# Patient Record
Sex: Male | Born: 1970
Health system: Southern US, Community
[De-identification: ages and names within clinical notes are randomized; demographics above are authoritative.]

## PROBLEM LIST (undated history)

## (undated) DIAGNOSIS — T7840XA Allergy, unspecified, initial encounter: Secondary | ICD-10-CM

## (undated) DIAGNOSIS — L719 Rosacea, unspecified: Secondary | ICD-10-CM

## (undated) DIAGNOSIS — B019 Varicella without complication: Secondary | ICD-10-CM

## (undated) DIAGNOSIS — E079 Disorder of thyroid, unspecified: Secondary | ICD-10-CM

## (undated) DIAGNOSIS — M1990 Unspecified osteoarthritis, unspecified site: Secondary | ICD-10-CM

## (undated) HISTORY — DX: Other disorders of bilirubin metabolism: E80.6

## (undated) HISTORY — DX: Allergy, unspecified, initial encounter: T78.40XA

## (undated) HISTORY — DX: Rosacea, unspecified: L71.9

## (undated) HISTORY — DX: Unspecified osteoarthritis, unspecified site: M19.90

## (undated) HISTORY — DX: Varicella without complication: B01.9

## (undated) HISTORY — PX: ANTERIOR CERVICAL DECOMP/DISCECTOMY FUSION: SHX1161

## (undated) HISTORY — PX: COLONOSCOPY: SHX174

## (undated) HISTORY — PX: TONSILLECTOMY: SUR1361

## (undated) HISTORY — PX: VASECTOMY: SHX75

## (undated) HISTORY — DX: Disorder of thyroid, unspecified: E07.9

---

## 2014-10-28 ENCOUNTER — Other Ambulatory Visit: Payer: Self-pay | Admitting: Physician Assistant

## 2014-10-28 ENCOUNTER — Ambulatory Visit
Admission: RE | Admit: 2014-10-28 | Discharge: 2014-10-28 | Disposition: A | Discharge status: Home / Self Care | Payer: Managed Care, Other (non HMO) | Source: Ambulatory Visit | Attending: Physician Assistant | Admitting: Physician Assistant

## 2014-10-28 DIAGNOSIS — M5489 Other dorsalgia: Secondary | ICD-10-CM

## 2016-09-06 DIAGNOSIS — M5136 Other intervertebral disc degeneration, lumbar region: Secondary | ICD-10-CM | POA: Diagnosis not present

## 2016-12-31 DIAGNOSIS — E039 Hypothyroidism, unspecified: Secondary | ICD-10-CM | POA: Diagnosis not present

## 2016-12-31 DIAGNOSIS — R5383 Other fatigue: Secondary | ICD-10-CM | POA: Diagnosis not present

## 2016-12-31 DIAGNOSIS — Z8639 Personal history of other endocrine, nutritional and metabolic disease: Secondary | ICD-10-CM | POA: Diagnosis not present

## 2017-01-14 DIAGNOSIS — R454 Irritability and anger: Secondary | ICD-10-CM | POA: Diagnosis not present

## 2017-01-14 DIAGNOSIS — R5382 Chronic fatigue, unspecified: Secondary | ICD-10-CM | POA: Diagnosis not present

## 2017-01-22 DIAGNOSIS — M84375A Stress fracture, left foot, initial encounter for fracture: Secondary | ICD-10-CM | POA: Diagnosis not present

## 2017-01-29 DIAGNOSIS — M84375D Stress fracture, left foot, subsequent encounter for fracture with routine healing: Secondary | ICD-10-CM | POA: Diagnosis not present

## 2017-02-12 DIAGNOSIS — M84375D Stress fracture, left foot, subsequent encounter for fracture with routine healing: Secondary | ICD-10-CM | POA: Diagnosis not present

## 2017-03-13 DIAGNOSIS — M545 Low back pain: Secondary | ICD-10-CM | POA: Diagnosis not present

## 2017-03-13 DIAGNOSIS — R252 Cramp and spasm: Secondary | ICD-10-CM | POA: Diagnosis not present

## 2017-04-23 DIAGNOSIS — M5416 Radiculopathy, lumbar region: Secondary | ICD-10-CM | POA: Diagnosis not present

## 2017-05-12 DIAGNOSIS — M5416 Radiculopathy, lumbar region: Secondary | ICD-10-CM | POA: Diagnosis not present

## 2017-05-13 ENCOUNTER — Encounter: Payer: Self-pay | Admitting: Family Medicine

## 2017-05-13 ENCOUNTER — Ambulatory Visit (INDEPENDENT_AMBULATORY_CARE_PROVIDER_SITE_OTHER): Payer: 59 | Admitting: Family Medicine

## 2017-05-13 VITALS — BP 124/70 | HR 70 | Temp 98.3°F | Resp 12 | Ht 71.0 in | Wt 251.2 lb

## 2017-05-13 DIAGNOSIS — E039 Hypothyroidism, unspecified: Secondary | ICD-10-CM

## 2017-05-13 DIAGNOSIS — J309 Allergic rhinitis, unspecified: Secondary | ICD-10-CM

## 2017-05-13 DIAGNOSIS — R5383 Other fatigue: Secondary | ICD-10-CM

## 2017-05-13 DIAGNOSIS — R6882 Decreased libido: Secondary | ICD-10-CM

## 2017-05-13 DIAGNOSIS — E559 Vitamin D deficiency, unspecified: Secondary | ICD-10-CM | POA: Diagnosis not present

## 2017-05-13 NOTE — Progress Notes (Signed)
HPI:   Daniel Juarez is a 46 y.o. male, who is here today to establish care.  Former PCP: Almyra Free Last preventive routine visit: 05/2016.  Chronic medical problems:  PUD for 30+ years, s/p anterior cervical discectomy in 2013. Allergic rhinitis.   Vit D deficiency, currently he is not on supplementation.Completed treatment in 01/2017. Dx about 4-5 years ago. He has not tolerated OTC Vit D, it exacerbated rosacea.  Hypothyroidism:  Currently he is on Synthroid 175 mcg daily. Dx in 2008. . Tolerating medication well, no side effects reported. He has not noted dysphagia, palpitations, abdominal pain, changes in bowel habits, tremor, cold/heat intolerance, or abnormal weight loss. He follows with endocrinologists, Dr Altheimer.  TSH in 01/2017 reported as normal range.  He follows with dermatologists for facial rosacea, currently on oral (? Doxycycline) and topical treatment. Dr Marline Backbone  OA, follows with ortho. He is on Meloxicam.  He lives with his wife and 3 children.   Concerns today:   Fatigue:  He has had it for at least 6 months, more constant for the past months or so.He is concerned about this to be related to low Vit D or abnormal thyroid function. Decreased libido. Denies ED but not as strong as it used to be.   He reports having work-up done by former PCP that included testosterone and TSH they were in normal range, thyroid function sightly higher and testosterone on lower normal range (12/2016 it was 304.7). He would like both re-check. 12/2016 CBC,CMP, TSH (1.71) K+ 5.4. 25 OH vit D 29.6. B12 605.  Hx of hyperbilirubinemia, last T. Bili 1.2.    Reports that he has had left foot stress fractures x 2 in the past few months, he follows with podiatrists.   He denies depression or anxiety. Sleeps well most of the time, 7 hours. No hx of OSA. Denies daytime hypersomnolence. Has increased caffeine intake for the past month for the  past 6 months.  He does not exercise regularly. He thinks his diet is healthy overall.   Review of Systems  Constitutional: Positive for fatigue. Negative for activity change, appetite change, fever and unexpected weight change.  HENT: Positive for congestion and rhinorrhea. Negative for facial swelling, nosebleeds, sinus pain, sore throat and trouble swallowing.   Eyes: Negative for redness and visual disturbance.  Respiratory: Negative for apnea, cough, shortness of breath and wheezing.   Cardiovascular: Negative for chest pain, palpitations and leg swelling.  Gastrointestinal: Negative for abdominal pain, nausea and vomiting.  Endocrine: Negative for cold intolerance, heat intolerance, polydipsia, polyphagia and polyuria.  Genitourinary: Negative for decreased urine volume, dysuria and hematuria.  Musculoskeletal: Positive for arthralgias. Negative for gait problem and myalgias.  Skin: Negative for pallor and wound.  Allergic/Immunologic: Positive for environmental allergies.  Neurological: Negative for dizziness, syncope, weakness and headaches.  Hematological: Negative for adenopathy. Does not bruise/bleed easily.  Psychiatric/Behavioral: Negative for confusion and sleep disturbance. The patient is nervous/anxious.      No current outpatient prescriptions on file prior to visit.   No current facility-administered medications on file prior to visit.      Past Medical History:  Diagnosis Date  . Allergy   . Arthritis   . Chicken pox   . Hyperbilirubinemia   . Rosacea, unspecified    Follows with dermatologists.  . Thyroid disease    Allergies no known allergies  Family History  Problem Relation Age of Onset  . Diabetes Mother   .  Hypertension Mother   . Atrial fibrillation Mother   . Multiple sclerosis Father   . Cerebral palsy Sister   . Atrial fibrillation Brother     Social History   Social History  . Marital status: Married    Spouse name: N/A  . Number  of children: N/A  . Years of education: N/A   Social History Main Topics  . Smoking status: Never Smoker  . Smokeless tobacco: Never Used  . Alcohol use Yes  . Drug use: No  . Sexual activity: Yes    Partners: Female   Other Topics Concern  . None   Social History Narrative  . None    Vitals:   05/13/17 0952  BP: 124/70  Pulse: 70  Resp: 12  Temp: 98.3 F (36.8 C)  SpO2: 97%    Body mass index is 35.04 kg/m.   Physical Exam  Nursing note and vitals reviewed. Constitutional: He is oriented to person, place, and time. He appears well-developed. No distress.  HENT:  Head: Normocephalic and atraumatic.  Mouth/Throat: Oropharynx is clear and moist and mucous membranes are normal.  Eyes: Pupils are equal, round, and reactive to light. Conjunctivae and EOM are normal.  Neck: No tracheal deviation present. No thyroid mass and no thyromegaly present.  Cardiovascular: Normal rate and regular rhythm.   No murmur heard. Pulses:      Dorsalis pedis pulses are 2+ on the right side, and 2+ on the left side.  Respiratory: Effort normal and breath sounds normal. No respiratory distress.  GI: Soft. He exhibits no mass. There is no hepatomegaly. There is no tenderness.  Musculoskeletal: He exhibits no edema or tenderness.  No signs of synovitis.  Lymphadenopathy:    He has no cervical adenopathy.  Neurological: He is alert and oriented to person, place, and time. He has normal strength. Coordination and gait normal.  Skin: Skin is warm. Rash (macular erythema on cheeks,nasal folds,and left preauricular.) noted. Rash is macular.  Psychiatric: His mood appears anxious.  Well groomed, good eye contact.    ASSESSMENT AND PLAN:   [name] was seen today for establish care.  Diagnoses and all orders for this visit:  Lab Results  Component Value Date   TSH 1.16 05/14/2017   Lab Results  Component Value Date   TESTOSTERONE 398.84 05/14/2017   Lab Results  Component Value  Date   ESRSEDRATE 3 05/14/2017   Lab Results  Component Value Date   CRP 0.5 05/14/2017   Lab Results  Component Value Date   CREATININE 0.96 05/14/2017   BUN 21 05/14/2017   NA 141 05/14/2017   K 4.4 05/14/2017   CL 104 05/14/2017   CO2 28 05/14/2017    Fatigue, unspecified type  We discussed possible etiologies: Systemic illness, immunologic,endocrinology,sleep disorder, psychiatric/psychologic, infectious,medications side effects, and idiopathic. Examination today does not suggest a serious process. Healthy diet and regular physical activity may help.  Further recommendations will be given according to lab results.  -     C-reactive protein; Future -     Testosterone; Future -     Sedimentation rate; Future -     Basic metabolic panel; Future  Hypothyroidism, unspecified type  No changes in current management, will follow labs done today and will give further recommendations accordingly. Continue following with endocrinologists.  -     T4, free; Future -     TSH; Future  Vitamin D deficiency, unspecified  Mild. Further recommendations will be given according to lab  results.  -     VITAMIN D 25 Hydroxy (Vit-D Deficiency, Fractures); Future  Allergic rhinitis, unspecified seasonality, unspecified trigger  Stable. This problem can contribute to fatigue. OTC antihistaminic or intranasal steroid may help.  Decreased libido  Possible causes discussed: Endocrine,meds,psychiatric among some. Further recommendations will be given according to lab results.  -     Testosterone; Future      Betty G. Martinique, MD  [facility]. [place] office.

## 2017-05-13 NOTE — Patient Instructions (Signed)
A few things to remember from today's visit:   Decreased libido - Plan: Testosterone  Hypothyroidism, unspecified type - Plan: T4, free, TSH  Vitamin D deficiency, unspecified - Plan: VITAMIN D 25 Hydroxy (Vit-D Deficiency, Fractures)  Allergic rhinitis, unspecified seasonality, unspecified trigger  Fatigue, unspecified type - Plan: C-reactive protein, Testosterone, Sedimentation rate, Basic metabolic panel  It is a common symptom associated with multiple factors: psychologic,medications, systemic illness, sleep disorders,infections, and unknown causes. Some work-up can be done to evaluate for common causes as thyroid disease,anemia,diabetes, or abnormalities in calcium,potassium,or sodium. Regular physical activity as tolerated and a healthy diet is usually might help and usually recommended for chronic fatigue.  Please be sure medication list is accurate. If a new problem present, please set up appointment sooner than planned today.

## 2017-05-14 ENCOUNTER — Other Ambulatory Visit (INDEPENDENT_AMBULATORY_CARE_PROVIDER_SITE_OTHER): Payer: 59

## 2017-05-14 DIAGNOSIS — E039 Hypothyroidism, unspecified: Secondary | ICD-10-CM | POA: Diagnosis not present

## 2017-05-14 DIAGNOSIS — R6882 Decreased libido: Secondary | ICD-10-CM | POA: Diagnosis not present

## 2017-05-14 DIAGNOSIS — E559 Vitamin D deficiency, unspecified: Secondary | ICD-10-CM

## 2017-05-14 DIAGNOSIS — R5383 Other fatigue: Secondary | ICD-10-CM | POA: Diagnosis not present

## 2017-05-14 LAB — T4, FREE: FREE T4: 1.04 ng/dL (ref 0.60–1.60)

## 2017-05-14 LAB — BASIC METABOLIC PANEL
BUN: 21 mg/dL (ref 6–23)
CALCIUM: 9.3 mg/dL (ref 8.4–10.5)
CHLORIDE: 104 meq/L (ref 96–112)
CO2: 28 meq/L (ref 19–32)
CREATININE: 0.96 mg/dL (ref 0.40–1.50)
GFR: 88.81 mL/min (ref >60.00)
GLUCOSE: 85 mg/dL (ref 70–99)
Potassium: 4.4 mEq/L (ref 3.5–5.1)
Sodium: 141 mEq/L (ref 135–145)

## 2017-05-14 LAB — TSH: TSH: 1.16 u[IU]/mL (ref 0.35–4.50)

## 2017-05-14 LAB — VITAMIN D 25 HYDROXY (VIT D DEFICIENCY, FRACTURES): VITD: 34.26 ng/mL (ref 30.00–100.00)

## 2017-05-14 LAB — TESTOSTERONE: Testosterone: 398.84 ng/dL (ref 300.00–890.00)

## 2017-05-14 LAB — SEDIMENTATION RATE: SED RATE: 3 mm/h (ref 0–15)

## 2017-05-14 LAB — C-REACTIVE PROTEIN: CRP: 0.5 mg/dL (ref 0.5–20.0)

## 2017-05-14 NOTE — Progress Notes (Unsigned)
lab

## 2017-05-16 ENCOUNTER — Encounter: Payer: Self-pay | Admitting: Family Medicine

## 2017-05-16 ENCOUNTER — Telehealth: Payer: Self-pay | Admitting: Family Medicine

## 2017-05-16 NOTE — Telephone Encounter (Signed)
Labs have been released to [employer].  Thanks!

## 2017-05-16 NOTE — Telephone Encounter (Signed)
Pt would like his lab results released to mychart. Pt has already gotten a call about results, but would like to look at them on mychart. thanks.

## 2017-05-18 ENCOUNTER — Other Ambulatory Visit: Payer: Self-pay | Admitting: Family Medicine

## 2017-05-18 MED ORDER — VITAMIN D (ERGOCALCIFEROL) 1.25 MG (50000 UNIT) PO CAPS: ORAL_CAPSULE | ORAL | 1 refills | Status: DC

## 2017-05-18 NOTE — Progress Notes (Signed)
ergo 

## 2017-05-21 DIAGNOSIS — E559 Vitamin D deficiency, unspecified: Secondary | ICD-10-CM | POA: Diagnosis not present

## 2017-05-21 DIAGNOSIS — M19072 Primary osteoarthritis, left ankle and foot: Secondary | ICD-10-CM | POA: Diagnosis not present

## 2017-05-21 DIAGNOSIS — M84375A Stress fracture, left foot, initial encounter for fracture: Secondary | ICD-10-CM | POA: Diagnosis not present

## 2017-05-22 ENCOUNTER — Encounter: Payer: Self-pay | Admitting: Family Medicine

## 2017-05-28 DIAGNOSIS — M84375D Stress fracture, left foot, subsequent encounter for fracture with routine healing: Secondary | ICD-10-CM | POA: Diagnosis not present

## 2017-06-03 DIAGNOSIS — Z8349 Family history of other endocrine, nutritional and metabolic diseases: Secondary | ICD-10-CM | POA: Diagnosis not present

## 2017-06-03 DIAGNOSIS — E039 Hypothyroidism, unspecified: Secondary | ICD-10-CM | POA: Diagnosis not present

## 2017-06-03 DIAGNOSIS — E559 Vitamin D deficiency, unspecified: Secondary | ICD-10-CM | POA: Diagnosis not present

## 2017-06-11 DIAGNOSIS — M84375D Stress fracture, left foot, subsequent encounter for fracture with routine healing: Secondary | ICD-10-CM | POA: Diagnosis not present

## 2017-08-27 DIAGNOSIS — L718 Other rosacea: Secondary | ICD-10-CM | POA: Diagnosis not present

## 2017-09-01 ENCOUNTER — Ambulatory Visit: Payer: 59 | Admitting: Nurse Practitioner

## 2017-09-01 ENCOUNTER — Encounter: Payer: Self-pay | Admitting: Nurse Practitioner

## 2017-09-01 VITALS — BP 114/76 | HR 93 | Temp 98.3°F | Ht 71.0 in | Wt 245.0 lb

## 2017-09-01 DIAGNOSIS — J069 Acute upper respiratory infection, unspecified: Secondary | ICD-10-CM

## 2017-09-01 LAB — POC INFLUENZA A&B (BINAX/QUICKVUE)
INFLUENZA A, POC: NEGATIVE
Influenza B, POC: NEGATIVE

## 2017-09-01 MED ORDER — ALBUTEROL SULFATE HFA 108 (90 BASE) MCG/ACT IN AERS: 1.0000 | INHALATION_SPRAY | Freq: Four times a day (QID) | RESPIRATORY_TRACT | 0 refills | Status: DC | PRN

## 2017-09-01 MED ORDER — DM-GUAIFENESIN ER 30-600 MG PO TB12: 1.0000 | ORAL_TABLET | Freq: Two times a day (BID) | ORAL | 0 refills | Status: DC | PRN

## 2017-09-01 MED ORDER — IPRATROPIUM BROMIDE 0.03 % NA SOLN: 2.0000 | Freq: Two times a day (BID) | NASAL | 0 refills | Status: DC

## 2017-09-01 MED ORDER — HYDROCODONE-HOMATROPINE 5-1.5 MG/5ML PO SYRP
5.0000 mL | ORAL_SOLUTION | Freq: Four times a day (QID) | ORAL | 0 refills | Status: DC | PRN
Start: 2017-09-01 — End: 2017-10-13

## 2017-09-01 NOTE — Progress Notes (Signed)
Subjective:  Patient ID: Daniel Juarez, male    DOB: [birth year]  Age: 46 y.o. MRN: [id number]  CC: Cough (coughing,congestion,chills,fever,bodyache,headache/ 2 days. )   URI   This is a new problem. The current episode started yesterday. The problem has been unchanged. The maximum temperature recorded prior to his arrival was 100.4 - 100.9 F. Associated symptoms include congestion, coughing, headaches, joint pain, a plugged ear sensation, rhinorrhea, sinus pain and a sore throat. Pertinent negatives include no vomiting or wheezing. He has tried acetaminophen and NSAIDs for the symptoms. The treatment provided mild relief.    Outpatient Medications Prior to Visit  Medication Sig Dispense Refill  . SYNTHROID 175 MCG tablet Take 1 tablet by mouth daily.    . Vitamin D, Ergocalciferol, (DRISDOL) 50000 units CAPS capsule 1 cap every 3 weeks. 6 capsule 1   No facility-administered medications prior to visit.     ROS See HPI  Objective:  BP 114/76   Pulse 93   Temp 98.3 F (36.8 C)   Ht 5\' 11"  (1.803 m)   Wt 245 lb (111.1 kg)   SpO2 96%   BMI 34.17 kg/m   BP Readings from Last 3 Encounters:  09/01/17 114/76  05/13/17 124/70    Wt Readings from Last 3 Encounters:  09/01/17 245 lb (111.1 kg)  05/13/17 251 lb 4 oz (114 kg)    Physical Exam  Constitutional: He is oriented to person, place, and time. No distress.  HENT:  Right Ear: Tympanic membrane, external ear and ear canal normal.  Left Ear: Tympanic membrane and ear canal normal.  Nose: Mucosal edema and rhinorrhea present. Right sinus exhibits no maxillary sinus tenderness and no frontal sinus tenderness. Left sinus exhibits no maxillary sinus tenderness and no frontal sinus tenderness.  Mouth/Throat: Uvula is midline. Posterior oropharyngeal erythema present. No oropharyngeal exudate.  Eyes: No scleral icterus.  Neck: Normal range of motion. Neck supple.  Cardiovascular: Normal rate and regular rhythm.    Pulmonary/Chest: Effort normal and breath sounds normal.  Lymphadenopathy:    He has no cervical adenopathy.  Neurological: He is alert and oriented to person, place, and time.  Vitals reviewed.   Lab Results  Component Value Date   GLUCOSE 85 05/14/2017   NA 141 05/14/2017   K 4.4 05/14/2017   CL 104 05/14/2017   CREATININE 0.96 05/14/2017   BUN 21 05/14/2017   CO2 28 05/14/2017   TSH 1.16 05/14/2017    Dg Lumbar Spine 2-3 Views  Result Date: 10/28/2014 CLINICAL DATA:  Low back pain for 2 years with tingling in both legs. No recent trauma. Initial encounter. EXAM: LUMBAR SPINE - 2-3 VIEW COMPARISON:  None. FINDINGS: Vertebral body height and alignment are normal. Loss of disc space height and endplate spurring are seen at L5-S1. Intervertebral disc space is otherwise maintained. Paraspinous structures are unremarkable. IMPRESSION: No acute abnormality.  Degenerative disc disease L5-S1. Electronically Signed   By: Inge Rise M.D.   On: 10/28/2014 14:07    Assessment & Plan:   Daniel Juarez was seen today for cough.  Diagnoses and all orders for this visit:  Acute URI -     POC Influenza A&B(BINAX/QUICKVUE) -     albuterol (PROVENTIL HFA;VENTOLIN HFA) 108 (90 Base) MCG/ACT inhaler; Inhale 1-2 puffs into the lungs every 6 (six) hours as needed. -     dextromethorphan-guaiFENesin (MUCINEX DM) 30-600 MG 12hr tablet; Take 1 tablet by mouth 2 (two) times daily as needed for cough. -  HYDROcodone-homatropine (HYCODAN) 5-1.5 MG/5ML syrup; Take 5 mLs by mouth every 6 (six) hours as needed for cough. -     ipratropium (ATROVENT) 0.03 % nasal spray; Place 2 sprays into both nostrils 2 (two) times daily. Do not use for more than 5days.   I am having Daniel Juarez start on albuterol, dextromethorphan-guaiFENesin, HYDROcodone-homatropine, and ipratropium. I am also having him maintain his SYNTHROID and Vitamin D (Ergocalciferol).  Meds ordered this encounter  Medications  . albuterol  (PROVENTIL HFA;VENTOLIN HFA) 108 (90 Base) MCG/ACT inhaler    Sig: Inhale 1-2 puffs into the lungs every 6 (six) hours as needed.    Dispense:  1 Inhaler    Refill:  0    Order Specific Question:   Supervising Provider    Answer:   Lucille Passy [[id number]]  . dextromethorphan-guaiFENesin (MUCINEX DM) 30-600 MG 12hr tablet    Sig: Take 1 tablet by mouth 2 (two) times daily as needed for cough.    Dispense:  14 tablet    Refill:  0    Order Specific Question:   Supervising Provider    Answer:   Lucille Passy [[id number]]  . HYDROcodone-homatropine (HYCODAN) 5-1.5 MG/5ML syrup    Sig: Take 5 mLs by mouth every 6 (six) hours as needed for cough.    Dispense:  60 mL    Refill:  0    Order Specific Question:   Supervising Provider    Answer:   Lucille Passy [[id number]]  . ipratropium (ATROVENT) 0.03 % nasal spray    Sig: Place 2 sprays into both nostrils 2 (two) times daily. Do not use for more than 5days.    Dispense:  30 mL    Refill:  0    Order Specific Question:   Supervising Provider    Answer:   Lucille Passy [[id number]]    Follow-up: Return if symptoms worsen or fail to improve.  Wilfred Lacy, NP

## 2017-09-01 NOTE — Patient Instructions (Addendum)
Negative for influenza A and B  URI Instructions: Encourage adequate oral hydration.  Use over-the-counter  "cold" medicines  such as "Tylenol cold" , "Advil cold",  "Mucinex" or" Mucinex D"  for cough and congestion.  Avoid decongestants if you have high blood pressure. Use" Delsym" or" Robitussin" cough syrup varietis for cough.  You can use plain "Tylenol" or "Advi"l for fever, chills and achyness.   "Common cold" symptoms are usually triggered by a virus.  The antibiotics are usually not necessary. On average, a" viral cold" illness would take 4-7 days to resolve. Please, make an appointment if you are not better or if you're worse.   Upper Respiratory Infection, Adult Most upper respiratory infections (URIs) are caused by a virus. A URI affects the nose, throat, and upper air passages. The most common type of URI is often called "the common cold." Follow these instructions at home:  Take medicines only as told by your doctor.  Gargle warm saltwater or take cough drops to comfort your throat as told by your doctor.  Use a warm mist humidifier or inhale steam from a shower to increase air moisture. This may make it easier to breathe.  Drink enough fluid to keep your pee (urine) clear or pale yellow.  Eat soups and other clear broths.  Have a healthy diet.  Rest as needed.  Go back to work when your fever is gone or your doctor says it is okay. ? You may need to stay home longer to avoid giving your URI to others. ? You can also wear a face mask and wash your hands often to prevent spread of the virus.  Use your inhaler more if you have asthma.  Do not use any tobacco products, including cigarettes, chewing tobacco, or electronic cigarettes. If you need help quitting, ask your doctor. Contact a doctor if:  You are getting worse, not better.  Your symptoms are not helped by medicine.  You have chills.  You are getting more short of breath.  You have brown or red  mucus.  You have yellow or brown discharge from your nose.  You have pain in your face, especially when you bend forward.  You have a fever.  You have puffy (swollen) neck glands.  You have pain while swallowing.  You have white areas in the back of your throat. Get help right away if:  You have very bad or constant: ? Headache. ? Ear pain. ? Pain in your forehead, behind your eyes, and over your cheekbones (sinus pain). ? Chest pain.  You have long-lasting (chronic) lung disease and any of the following: ? Wheezing. ? Long-lasting cough. ? Coughing up blood. ? A change in your usual mucus.  You have a stiff neck.  You have changes in your: ? Vision. ? Hearing. ? Thinking. ? Mood. This information is not intended to replace advice given to you by your health care provider. Make sure you discuss any questions you have with your health care provider. Document Released: 02/05/2008 Document Revised: 04/21/2016 Document Reviewed: 11/24/2013 Elsevier Interactive Patient Education  2018 [employer].

## 2017-09-03 ENCOUNTER — Telehealth: Payer: Self-pay | Admitting: Family Medicine

## 2017-09-03 DIAGNOSIS — R110 Nausea: Secondary | ICD-10-CM

## 2017-09-03 DIAGNOSIS — J0140 Acute pansinusitis, unspecified: Secondary | ICD-10-CM

## 2017-09-03 MED ORDER — AMOXICILLIN-POT CLAVULANATE 875-125 MG PO TABS: 1.0000 | ORAL_TABLET | Freq: Two times a day (BID) | ORAL | 0 refills | Status: DC

## 2017-09-03 MED ORDER — ONDANSETRON HCL 4 MG PO TABS: 4.0000 mg | ORAL_TABLET | Freq: Three times a day (TID) | ORAL | 0 refills | Status: DC | PRN

## 2017-09-03 NOTE — Telephone Encounter (Signed)
Copied from [place] [id number]. Topic: Quick Communication - See Telephone Encounter >> Sep 03, 2017 11:03 AM Ivar Drape wrote: CRM for notification. See Telephone encounter for:  09/03/17. Patient saw Wilfred Lacy on 09/01/17 and she told him to inform her if his fever does not go away.  The patient stated he still has a fever, and he also has other symptoms: an awful headache in the front of his head that will not go away, nausea, upset stomach.

## 2017-09-03 NOTE — Telephone Encounter (Signed)
Pt was wondering what to do about this nausea and stomach upset. He tried pepto bismol but its not helping at all. Please advise.

## 2017-09-03 NOTE — Telephone Encounter (Signed)
Pt is aware of massage below.  

## 2017-09-30 ENCOUNTER — Other Ambulatory Visit: Payer: Self-pay | Admitting: Nurse Practitioner

## 2017-09-30 MED ORDER — ALBUTEROL SULFATE HFA 108 (90 BASE) MCG/ACT IN AERS: 1.0000 | INHALATION_SPRAY | Freq: Four times a day (QID) | RESPIRATORY_TRACT | 0 refills | Status: DC | PRN

## 2017-10-12 NOTE — Progress Notes (Signed)
HPI:  [name] is a 47 y.o.male here today for his routine physical examination.  Last CPE: 05/2016 He lives with his wife and 3 boys.  Regular exercise 3 or more times per week: Plays basketball a few times per week with his children. Following a healthy diet: Yes.   Chronic medical problems: Hypothyroidism,he follows with Dr Altheimer. PUD,cervicalgia s/p discectomy in 2013,vit D deficiency,seasonal allergies,and fatigue among some.  Hx of STD's: Denies   There is no immunization history on file for this patient.   -Denies high alcohol intake, tobacco use, or Hx of illicit drug use.  -Concerns and/or follow up today:   Since his last OV he had another foot fracture, left foot. 2 foot fractures in 3 months.  For the past 2 weeks he has ahd "sinus" issues. Nasal congestion, rhinorrhea, postnasal drainage.  He denies fever, chills, body aches, or sore throat. History of allergic rhinitis, he is not using any OTC medication.  Hypothyroidism:  He is also requesting a TSH check. Currently on Synthroid 175 mcg daily. Tolerating medication well, no side effects reported. He has not noted palpitations, abdominal pain, changes in bowel habits, tremor, cold/heat intolerance, or abnormal weight loss.  Lab Results  Component Value Date   TSH 1.16 05/14/2017    According to patient, he follows with endocrinology as needed.  Vitamin D deficiency: He has not taking Ergocalciferol in a few months.  OTC vitamin D supplementation causes facial erythema, rosacea exacerbation. Last 25 [place] vitamin D in 05/2017 was 34.2.    Review of Systems  Constitutional: Negative for activity change, appetite change, fatigue, fever and unexpected weight change.  HENT: Positive for congestion, postnasal drip, rhinorrhea and sinus pressure. Negative for dental problem, facial swelling, nosebleeds, sore throat, trouble swallowing and voice change.   Eyes: Negative for redness and  visual disturbance.  Respiratory: Negative for cough, shortness of breath and wheezing.   Cardiovascular: Negative for chest pain, palpitations and leg swelling.  Gastrointestinal: Negative for abdominal pain, blood in stool, nausea and vomiting.  Endocrine: Negative for cold intolerance, heat intolerance, polydipsia, polyphagia and polyuria.  Genitourinary: Negative for decreased urine volume, dysuria, genital sores, hematuria and testicular pain.  Musculoskeletal: Negative for gait problem and myalgias.  Skin: Negative for color change and rash.  Allergic/Immunologic: Positive for environmental allergies.  Neurological: Negative for dizziness, syncope, weakness and headaches.  Hematological: Negative for adenopathy. Does not bruise/bleed easily.  Psychiatric/Behavioral: Negative for confusion and sleep disturbance. The patient is not nervous/anxious.   All other systems reviewed and are negative.    Current Outpatient Medications on File Prior to Visit  Medication Sig Dispense Refill  . SYNTHROID 175 MCG tablet Take 1 tablet by mouth daily.     No current facility-administered medications on file prior to visit.      Past Medical History:  Diagnosis Date  . Allergy   . Arthritis   . Chicken pox   . Hyperbilirubinemia   . Rosacea, unspecified    Follows with dermatologists.  . Thyroid disease     Past Surgical History:  Procedure Laterality Date  . TONSILLECTOMY      No Known Allergies  Family History  Problem Relation Age of Onset  . Diabetes Mother   . Hypertension Mother   . Atrial fibrillation Mother   . Multiple sclerosis Father   . Cerebral palsy Sister   . Atrial fibrillation Brother     Social History   Socioeconomic History  .  Marital status: Married    Spouse name: None  . Number of children: None  . Years of education: None  . Highest education level: None  Social Needs  . Financial resource strain: None  . Food insecurity - worry: None  .  Food insecurity - inability: None  . Transportation needs - medical: None  . Transportation needs - non-medical: None  Occupational History  . None  Tobacco Use  . Smoking status: Never Smoker  . Smokeless tobacco: Never Used  Substance and Sexual Activity  . Alcohol use: Yes  . Drug use: No  . Sexual activity: Yes    Partners: Female  Other Topics Concern  . None  Social History Narrative  . None     Vitals:   10/13/17 0814  BP: 122/76  Pulse: 82  Resp: 12  Temp: 98.5 F (36.9 C)  SpO2: 97%   Body mass index is 33.49 kg/m.   Wt Readings from Last 3 Encounters:  10/13/17 240 lb 2 oz (108.9 kg)  09/01/17 245 lb (111.1 kg)  05/13/17 251 lb 4 oz (114 kg)      Physical Exam  Nursing note and vitals reviewed. Constitutional: He is oriented to person, place, and time. He appears well-developed. No distress.  HENT:  Head: Normocephalic and atraumatic.  Right Ear: Hearing, tympanic membrane, external ear and ear canal normal.  Left Ear: Hearing, tympanic membrane, external ear and ear canal normal.  Nose: Rhinorrhea present. Right sinus exhibits no maxillary sinus tenderness and no frontal sinus tenderness. Left sinus exhibits no maxillary sinus tenderness and no frontal sinus tenderness.  Mouth/Throat: Oropharynx is clear and moist and mucous membranes are normal.  Hypertrophic turbinates. Nasal voice. Postnasal drainage.  Eyes: Conjunctivae and EOM are normal. Pupils are equal, round, and reactive to light.  Neck: Normal range of motion. No tracheal deviation present. No thyromegaly present.  Cardiovascular: Normal rate and regular rhythm.  No murmur heard. Pulses:      Dorsalis pedis pulses are 2+ on the right side, and 2+ on the left side.  Respiratory: Effort normal and breath sounds normal. No respiratory distress.  GI: Soft. He exhibits no mass. There is no tenderness.  Genitourinary:  Genitourinary Comments: Refused,no concerns.  Musculoskeletal: He  exhibits no edema or tenderness.  No major deformities appreciated and no signs of synovitis.  Lymphadenopathy:    He has no cervical adenopathy.       Right: No supraclavicular adenopathy present.       Left: No supraclavicular adenopathy present.  Neurological: He is alert and oriented to person, place, and time. He has normal strength. No cranial nerve deficit or sensory deficit. Coordination and gait normal.  Reflex Scores:      Bicep reflexes are 2+ on the right side and 2+ on the left side.      Patellar reflexes are 2+ on the right side and 2+ on the left side. Skin: Skin is warm. No rash noted. No erythema.  Psychiatric: His mood appears anxious.  Well-groomed, good eye contact.     ASSESSMENT AND PLAN:   Mr. Geddy was seen today for annual exam.  Orders Placed This Encounter  Procedures  . DG Bone Density  . Tdap vaccine greater than or equal to 7yo IM  . Comprehensive metabolic panel  . TSH  . Lipid panel  . VITAMIN D 25 Hydroxy (Vit-D Deficiency, Fractures)    Routine general medical examination at a health care facility  We discussed the  importance of regular physical activity and healthy diet for prevention of chronic illness and/or complications. Preventive guidelines reviewed. Vaccination updated.  Ca++ and vit D supplementation recommended. Next CPE in a year.  The 10-year ASCVD risk score Mikey Bussing DC Brooke Bonito., et al., 2013) is: 1.7%   Values used to calculate the score:     Age: 43 years     Sex: Male     Is Non-Hispanic African American: No     Diabetic: No     Tobacco smoker: No     Systolic Blood Pressure: 332 mmHg     Is BP treated: No     HDL Cholesterol: 53.3 mg/dL     Total Cholesterol: 159 mg/dL Diabetes mellitus screening -     Comprehensive metabolic panel  Screening for lipid disorders -     Lipid panel  History of pathologic fracture -     DG Bone Density; Future  Need for Tdap vaccination -     Tdap vaccine greater than or equal to  7yo IM  Hypothyroidism, unspecified No changes in current management, will follow labs done today and will give further recommendations accordingly. He continues following with endocrinologist.  Vitamin D deficiency, unspecified He will continue Ergocalciferol 50,000 U q 3 weeks. Further recommendations will be given according to lab results. F/U in 6-12 months.     Allergic rhinitis Educated about Dx. Flonase nasal spray,saline irrigations,and OTC antihistaminics may help. F/U as needed.     Return in 1 year (on 10/13/2018).    Betty G. Martinique, MD  [facility]. [place] office.

## 2017-10-13 ENCOUNTER — Ambulatory Visit (INDEPENDENT_AMBULATORY_CARE_PROVIDER_SITE_OTHER): Payer: 59 | Admitting: Family Medicine

## 2017-10-13 ENCOUNTER — Encounter: Payer: Self-pay | Admitting: Family Medicine

## 2017-10-13 VITALS — BP 122/76 | HR 82 | Temp 98.5°F | Resp 12 | Ht 71.0 in | Wt 240.1 lb

## 2017-10-13 DIAGNOSIS — Z23 Encounter for immunization: Secondary | ICD-10-CM | POA: Diagnosis not present

## 2017-10-13 DIAGNOSIS — J309 Allergic rhinitis, unspecified: Secondary | ICD-10-CM

## 2017-10-13 DIAGNOSIS — E039 Hypothyroidism, unspecified: Secondary | ICD-10-CM | POA: Diagnosis not present

## 2017-10-13 DIAGNOSIS — E559 Vitamin D deficiency, unspecified: Secondary | ICD-10-CM

## 2017-10-13 DIAGNOSIS — Z87311 Personal history of (healed) other pathological fracture: Secondary | ICD-10-CM

## 2017-10-13 DIAGNOSIS — Z0000 Encounter for general adult medical examination without abnormal findings: Secondary | ICD-10-CM | POA: Diagnosis not present

## 2017-10-13 DIAGNOSIS — Z13220 Encounter for screening for lipoid disorders: Secondary | ICD-10-CM | POA: Diagnosis not present

## 2017-10-13 DIAGNOSIS — Z131 Encounter for screening for diabetes mellitus: Secondary | ICD-10-CM

## 2017-10-13 LAB — LIPID PANEL
CHOLESTEROL: 159 mg/dL (ref 0–200)
HDL: 53.3 mg/dL (ref >39.00)
LDL CALC: 77 mg/dL (ref 0–99)
NonHDL: 105.26
Total CHOL/HDL Ratio: 3
Triglycerides: 143 mg/dL (ref 0.0–149.0)
VLDL: 28.6 mg/dL (ref 0.0–40.0)

## 2017-10-13 LAB — COMPREHENSIVE METABOLIC PANEL
ALBUMIN: 4.4 g/dL (ref 3.5–5.2)
ALK PHOS: 64 U/L (ref 39–117)
ALT: 39 U/L (ref 0–53)
AST: 27 U/L (ref 0–37)
BILIRUBIN TOTAL: 1.3 mg/dL — AB (ref 0.2–1.2)
BUN: 18 mg/dL (ref 6–23)
CALCIUM: 9.5 mg/dL (ref 8.4–10.5)
CO2: 31 meq/L (ref 19–32)
CREATININE: 0.82 mg/dL (ref 0.40–1.50)
Chloride: 101 mEq/L (ref 96–112)
GFR: 106.35 mL/min (ref >60.00)
Glucose, Bld: 88 mg/dL (ref 70–99)
Potassium: 4.8 mEq/L (ref 3.5–5.1)
Sodium: 141 mEq/L (ref 135–145)
TOTAL PROTEIN: 7.4 g/dL (ref 6.0–8.3)

## 2017-10-13 LAB — TSH: TSH: 2.2 u[IU]/mL (ref 0.35–4.50)

## 2017-10-13 LAB — VITAMIN D 25 HYDROXY (VIT D DEFICIENCY, FRACTURES): VITD: 36.99 ng/mL (ref 30.00–100.00)

## 2017-10-13 MED ORDER — VITAMIN D (ERGOCALCIFEROL) 1.25 MG (50000 UNIT) PO CAPS: ORAL_CAPSULE | ORAL | 3 refills | Status: DC

## 2017-10-13 MED ORDER — FLUTICASONE PROPIONATE 50 MCG/ACT NA SUSP: 1.0000 | Freq: Two times a day (BID) | NASAL | 6 refills | Status: DC

## 2017-10-13 NOTE — Assessment & Plan Note (Signed)
No changes in current management, will follow labs done today and will give further recommendations accordingly. He continues following with endocrinologist.

## 2017-10-13 NOTE — Assessment & Plan Note (Signed)
He will continue Ergocalciferol 50,000 U q 3 weeks. Further recommendations will be given according to lab results. F/U in 6-12 months.

## 2017-10-13 NOTE — Patient Instructions (Addendum)
A few things to remember from today's visit: Today you were here for a physical and other problems were also adressed.   Routine general medical examination at a health care facility  Diabetes mellitus screening - Plan: Comprehensive metabolic panel  Screening for lipid disorders - Plan: Lipid panel  History of pathologic fracture - Plan: DG Bone Density  Hypothyroidism, unspecified type - Plan: TSH  Vitamin D deficiency, unspecified - Plan: VITAMIN D 25 Hydroxy (Vit-D Deficiency, Fractures)  Allergic rhinitis, unspecified seasonality, unspecified trigger - Plan: fluticasone (FLONASE) 50 MCG/ACT nasal spray   At least 150 minutes of moderate exercise per week, daily brisk walking for 15-30 min is a good exercise option. Healthy diet low in saturated (animal) fats and sweets and consisting of fresh fruits and vegetables, lean meats such as fish and white chicken and whole grains.  - Vaccines:  Tdap vaccine every 10 years.  Shingles vaccine recommended at age 39, could be given after 49 years of age but not sure about insurance coverage.  Pneumonia vaccines:  Prevnar 35 at 91 and Pneumovax at 15.   -Screening recommendations for low/normal risk males:  Screening for diabetes at age 74 and every 3 years. Earlier screening if cardiovascular risk factors.   Lipid screening at 35 and every 3 years. Screening starts in younger males with cardiovascular risk factors.  Colon cancer screening at age 66 and until age 61.  Prostate cancer screening: some controversy, starts usually at 13: Rectal exam and PSA.  Aortic Abdominal Aneurism once between 2 and 53 years old if ever smoker.  Also recommended:  1. Dental visit- Brush and floss your teeth twice daily; visit your dentist twice a year. 2. Eye doctor- Get an eye exam at least every 2 years. 3. Helmet use- Always wear a helmet when riding a bicycle, motorcycle, rollerblading or skateboarding. 4. Safe sex- If you may be  exposed to sexually transmitted infections, use a condom. 5. Seat belts- Seat belts can save your live; always wear one. 6. Smoke/Carbon Monoxide detectors- These detectors need to be installed on the appropriate level of your home. Replace batteries at least once a year. 7. Skin cancer- When out in the sun please cover up and use sunscreen 15 SPF or higher. 8. Violence- If anyone is threatening or hurting you, please tell your healthcare provider.  9. Drink alcohol in moderation- Limit alcohol intake to one drink or less per day. Never drink and drive.  There are 2 forms of allergic rhinitis: . Seasonal (hay fever): Caused by an allergy to pollen and/or mold spores in the air. Pollen is the fine powder that comes from the stamen of flowering plants. It can be carried through the air and is easily inhaled. Symptoms are seasonal and usually occur in spring, late summer, and fall. Marland Kitchen Perennial: Caused by other allergens such as dust mites, pet hair or dander, or mold. Symptoms occur year-round.  Symptoms: Your symptoms can vary, depending on the severity of your allergies. Symptoms can include: Sneezing, coughing.itching (mostly eyes, nose, mouth, throat and skin),runny nose,stuffy nose.headache,pressure in the nose and cheeks,ear fullness and popping, sore throat.watery, red, or swollen eyes,dark circles under your eyes,trouble smelling, and sometimes hives.  Allergic rhinitis cannot be prevented. You can help your symptoms by avoiding the things that you are allergic, including: . Keeping windows closed. This is especially important during high-pollen seasons. . Washing your hands after petting animals. . Using dust- and mite-proof bedding and mattress covers. . Wearing glasses outside  to protect your eyes. . Showering before bed to wash off allergens from hair and skin. You can also avoid things that can make your symptoms worse, such as: . aerosol sprays . air pollution . cold  temperatures . humidity . irritating fumes . tobacco smoke . wind . wood smoke.   Antihistamines help reduce the sneezing, runny nose, and itchiness of allergies. These come in pill form and as nasal sprays. Allegra,Zyrtec,or Claritin are some examples. Decongestants, such as pseudoephedrine and phenylephrine, help temporarily relieve the stuffy nose of allergies. Decongestants are found in many medicines and come as pills, nose sprays, and nose drops. They could increase heart rate and cause tachycardia and tremor. Nasal Afrin should not be used for more than 3 days because you can become dependent on them. This causes you to feel even more stopped-up when you try to quit using them.  Nasal sprays: steroids or antihistaminics. Over the counter intranasal sterids: Nasocort,Rhinocort,or Flonase.You won't notice their benefits for up to 2 weeks after starting them. Allergy shots or sublingual tablets when other treatment do not help.This is done by immunologists.   Please be sure medication list is accurate. If a new problem present, please set up appointment sooner than planned today.

## 2017-10-13 NOTE — Assessment & Plan Note (Signed)
Educated about Dx. Flonase nasal spray,saline irrigations,and OTC antihistaminics may help. F/U as needed.

## 2017-10-19 ENCOUNTER — Encounter: Payer: Self-pay | Admitting: Family Medicine

## 2017-11-07 ENCOUNTER — Other Ambulatory Visit: Payer: Self-pay | Admitting: *Deleted

## 2017-11-07 DIAGNOSIS — E559 Vitamin D deficiency, unspecified: Secondary | ICD-10-CM

## 2017-11-07 MED ORDER — VITAMIN D (ERGOCALCIFEROL) 1.25 MG (50000 UNIT) PO CAPS: ORAL_CAPSULE | ORAL | 3 refills | Status: DC

## 2017-11-12 ENCOUNTER — Ambulatory Visit
Admission: RE | Admit: 2017-11-12 | Discharge: 2017-11-12 | Disposition: A | Discharge status: Home / Self Care | Payer: 59 | Source: Ambulatory Visit | Attending: Family Medicine | Admitting: Family Medicine

## 2017-11-12 DIAGNOSIS — M85851 Other specified disorders of bone density and structure, right thigh: Secondary | ICD-10-CM | POA: Diagnosis not present

## 2017-11-12 DIAGNOSIS — Z87311 Personal history of (healed) other pathological fracture: Secondary | ICD-10-CM

## 2017-11-15 ENCOUNTER — Encounter: Payer: Self-pay | Admitting: Family Medicine

## 2017-11-26 ENCOUNTER — Ambulatory Visit: Payer: 59 | Admitting: Family Medicine

## 2017-11-26 ENCOUNTER — Encounter: Payer: Self-pay | Admitting: Family Medicine

## 2017-11-26 ENCOUNTER — Ambulatory Visit (INDEPENDENT_AMBULATORY_CARE_PROVIDER_SITE_OTHER): Payer: 59 | Admitting: Family Medicine

## 2017-11-26 VITALS — BP 118/72 | HR 72 | Temp 98.6°F | Resp 12 | Ht 71.0 in | Wt 239.2 lb

## 2017-11-26 DIAGNOSIS — J069 Acute upper respiratory infection, unspecified: Secondary | ICD-10-CM | POA: Diagnosis not present

## 2017-11-26 DIAGNOSIS — J029 Acute pharyngitis, unspecified: Secondary | ICD-10-CM

## 2017-11-26 LAB — POC INFLUENZA A&B (BINAX/QUICKVUE)
INFLUENZA A, POC: NEGATIVE
INFLUENZA B, POC: NEGATIVE

## 2017-11-26 LAB — POCT RAPID STREP A (OFFICE): RAPID STREP A SCREEN: NEGATIVE

## 2017-11-26 MED ORDER — BENZONATATE 100 MG PO CAPS: 200.0000 mg | ORAL_CAPSULE | Freq: Two times a day (BID) | ORAL | 0 refills | Status: AC | PRN

## 2017-11-26 MED ORDER — FLUTICASONE PROPIONATE 50 MCG/ACT NA SUSP: 1.0000 | Freq: Two times a day (BID) | NASAL | 1 refills | Status: DC

## 2017-11-26 MED ORDER — MAGIC MOUTHWASH W/LIDOCAINE: 5.0000 mL | Freq: Three times a day (TID) | ORAL | 0 refills | Status: AC | PRN

## 2017-11-26 NOTE — Patient Instructions (Signed)
A few things to remember from today's visit:   Sore throat - Plan: POC Influenza A&B (Binax test), POC Rapid Strep A, Culture, Group A Strep  Generalized body aches - Plan: POC Influenza A&B (Binax test), POC Rapid Strep A  Fatigue, unspecified type - Plan: POC Influenza A&B (Binax test), POC Rapid Strep A   Symptomatic treatment: Over the counter Acetaminophen 500 mg and/or Ibuprofen (400-600 mg) if there is not contraindications; you can alternate in between both every 4-6 hours. Gargles with saline water and throat lozenges might also help. Cold fluids. Cough and nasal congestion can last a few weeks.    Seek prompt medical evaluation if you are having difficulty breathing, mouth swelling, throat closing up, not able to swallow liquids (drooling), skin rash/bruising, or worsening symptoms.  Please follow up in 2 weeks if not any better.    Please be sure medication list is accurate. If a new problem present, please set up appointment sooner than planned today.

## 2017-11-26 NOTE — Progress Notes (Signed)
ACUTE VISIT  HPI:  Chief Complaint  Patient presents with  . Sore Throat  . Cough    sx started Sunday, taking OTC medications  . Nasal Congestion  . Generalized Body Aches  . Otalgia    Daniel Juarez is a 47 y.o.male here today complaining of 3 days of respiratory symptoms.  Symptoms are started Sunday night, intermittent nonproductive cough. Subjective fever, chills, and sweats when symptoms first started and lasted 24 hours.  Bilateral earache and sore throat. He is more concerned about sore throat, moderate, exacerbated by swallowing and talking. He denies stridor or dysphasia.  He has tried Tourist information centre manager, NyQuil, [place].  URI   This is a new problem. The current episode started in the past 7 days. The problem has been unchanged. Associated symptoms include congestion, coughing, ear pain, a plugged ear sensation, rhinorrhea and a sore throat. Pertinent negatives include no abdominal pain, diarrhea, headaches, nausea, neck pain, rash, sinus pain, sneezing, swollen glands, vomiting or wheezing. He has tried acetaminophen for the symptoms. The treatment provided mild relief.    No Hx of recent travel. + Sick contact: Wife and children with similar symptoms. No known insect bite.  Hx of allergies: Yes, history of allergic rhinitis.   Review of Systems  Constitutional: Positive for appetite change, chills, fatigue and fever. Negative for activity change.  HENT: Positive for congestion, ear pain, postnasal drip, rhinorrhea and sore throat. Negative for facial swelling, hearing loss, mouth sores, sinus pain, sneezing, trouble swallowing and voice change.   Eyes: Negative for discharge, redness and itching.  Respiratory: Positive for cough. Negative for chest tightness, shortness of breath and wheezing.   Gastrointestinal: Negative for abdominal pain, diarrhea, nausea and vomiting.  Musculoskeletal: Positive for myalgias. Negative for gait problem and neck pain.   Skin: Negative for rash.  Allergic/Immunologic: Positive for environmental allergies.  Neurological: Negative for syncope, weakness and headaches.  Hematological: Negative for adenopathy. Does not bruise/bleed easily.  Psychiatric/Behavioral: The patient is nervous/anxious.       Current Outpatient Medications on File Prior to Visit  Medication Sig Dispense Refill  . SYNTHROID 175 MCG tablet Take 1 tablet by mouth daily.    . Vitamin D, Ergocalciferol, (DRISDOL) 50000 units CAPS capsule 1 cap every 3 weeks. 6 capsule 3   No current facility-administered medications on file prior to visit.      Past Medical History:  Diagnosis Date  . Allergy   . Arthritis   . Chicken pox   . Hyperbilirubinemia   . Rosacea, unspecified    Follows with dermatologists.  . Thyroid disease    No Known Allergies  Social History   Socioeconomic History  . Marital status: Married    Spouse name: Not on file  . Number of children: Not on file  . Years of education: Not on file  . Highest education level: Not on file  Occupational History  . Not on file  Social Needs  . Financial resource strain: Not on file  . Food insecurity:    Worry: Not on file    Inability: Not on file  . Transportation needs:    Medical: Not on file    Non-medical: Not on file  Tobacco Use  . Smoking status: Never Smoker  . Smokeless tobacco: Never Used  Substance and Sexual Activity  . Alcohol use: Yes  . Drug use: No  . Sexual activity: Yes    Partners: Female  Lifestyle  .  Physical activity:    Days per week: Not on file    Minutes per session: Not on file  . Stress: Not on file  Relationships  . Social connections:    Talks on phone: Not on file    Gets together: Not on file    Attends religious service: Not on file    Active member of club or organization: Not on file    Attends meetings of clubs or organizations: Not on file    Relationship status: Not on file  Other Topics Concern  . Not on  file  Social History Narrative  . Not on file    Vitals:   11/26/17 1612  BP: 118/72  Pulse: 72  Resp: 12  Temp: 98.6 F (37 C)  SpO2: 97%   Body mass index is 33.37 kg/m.   Physical Exam  Nursing note and vitals reviewed. Constitutional: He is oriented to person, place, and time. He appears well-developed. He does not appear ill. No distress.  HENT:  Head: Normocephalic and atraumatic.  Right Ear: Tympanic membrane, external ear and ear canal normal.  Left Ear: Tympanic membrane, external ear and ear canal normal.  Nose: Rhinorrhea present. Right sinus exhibits no maxillary sinus tenderness and no frontal sinus tenderness. Left sinus exhibits no maxillary sinus tenderness and no frontal sinus tenderness.  Mouth/Throat: Uvula is midline and mucous membranes are normal. Posterior oropharyngeal erythema present. No oropharyngeal exudate or posterior oropharyngeal edema.  Eyes: Conjunctivae are normal.  Cardiovascular: Normal rate and regular rhythm.  No murmur heard. Respiratory: Effort normal and breath sounds normal. No stridor. No respiratory distress.  Lymphadenopathy:       Head (right side): No submandibular adenopathy present.       Head (left side): No submandibular adenopathy present.    He has no cervical adenopathy.  Neurological: He is alert and oriented to person, place, and time. He has normal strength. Gait normal.  Skin: Skin is warm. No rash noted. No erythema.  Psychiatric: His speech is normal. His mood appears anxious.  Well groomed, good eye contact.    ASSESSMENT AND PLAN:   [name] was seen today for sore throat, cough, nasal congestion, generalized body aches and otalgia.  Diagnoses and all orders for this visit:  Sore throat  Rapid strep and flu test here in the office negative. We will follow strep culture. Symptomatic treatment recommended for now.  -     POC Influenza A&B (Binax test) -     POC Rapid Strep A -     Culture, Group A  Strep -     magic mouthwash w/lidocaine SOLN; Take 5 mLs by mouth 3 (three) times daily as needed for up to 10 days for mouth pain. 50 ml of diphenhydramine, alum and mag hydroxide, and lidocaine to make 150 ml  URI, acute  Symptoms suggests a viral etiology, symptomatic treatment recommended at this time. Instructed to monitor for signs of complications, instructed about warning signs. I also explained that cough and nasal congestion can last a few days and sometimes weeks. F/U as needed.   -     POC Influenza A&B (Binax test) -     POC Rapid Strep A -     benzonatate (TESSALON) 100 MG capsule; Take 2 capsules (200 mg total) by mouth 2 (two) times daily as needed for up to 10 days. -     fluticasone (FLONASE) 50 MCG/ACT nasal spray; Place 1 spray into both nostrils 2 (two) times  daily.    -Mr. Tomoya Ringwald was advised to seek attention immediately if symptoms worsen or to follow if they persist or new concerns arise.       Betty G. Martinique, MD  [facility]. [place] office.

## 2017-11-28 LAB — CULTURE, GROUP A STREP
MICRO NUMBER:: 90382570
SPECIMEN QUALITY:: ADEQUATE

## 2017-11-30 ENCOUNTER — Encounter: Payer: Self-pay | Admitting: Family Medicine

## 2018-06-30 DIAGNOSIS — L218 Other seborrheic dermatitis: Secondary | ICD-10-CM | POA: Diagnosis not present

## 2018-06-30 DIAGNOSIS — L821 Other seborrheic keratosis: Secondary | ICD-10-CM | POA: Diagnosis not present

## 2018-07-23 ENCOUNTER — Telehealth: Payer: Self-pay | Admitting: Family Medicine

## 2018-07-23 DIAGNOSIS — Z0279 Encounter for issue of other medical certificate: Secondary | ICD-10-CM

## 2018-07-23 NOTE — Telephone Encounter (Signed)
Patient dropped off Physician Results Form  Fax the form to: [phone]  Call patient to let him know that the form was faxed at: [phone]  Disposition: Dr's Folder

## 2018-07-24 NOTE — Telephone Encounter (Signed)
Form placed on providers desk for completion

## 2018-07-27 NOTE — Telephone Encounter (Signed)
Form faxed and left detailed message informing patient that form was faxed.

## 2018-08-05 ENCOUNTER — Other Ambulatory Visit: Payer: Self-pay | Admitting: Family Medicine

## 2018-08-05 NOTE — Telephone Encounter (Signed)
Requested medication (s) are due for refill today -yes  Requested medication (s) are on the active medication list -yes  Future visit scheduled -yes  Last refill: 04/06/17  Notes to clinic: medication is listed as historical on patient medication list- sent for PCP review and new Rx  Requested Prescriptions  Pending Prescriptions Disp Refills   SYNTHROID 175 MCG tablet      Sig: Take 1 tablet (175 mcg total) by mouth daily.     Endocrinology:  Hypothyroid Agents Failed - 08/05/2018 10:27 AM      Failed - TSH needs to be rechecked within 3 months after an abnormal result. Refill until TSH is due.      Passed - TSH in normal range and within 360 days    TSH  Date Value Ref Range Status  10/13/2017 2.20 0.35 - 4.50 uIU/mL Final         Passed - Valid encounter within last 12 months    Recent Outpatient Visits          8 months ago Sore throat   [place] [facility] [place], Malka So, MD   9 months ago Routine general medical examination at a health care facility   [facility] [place], Malka So, MD   11 months ago Acute URI   [facility], Charlene Brooke, NP   1 year ago Fatigue, unspecified type   [facility] [place], Malka So, MD      Future Appointments            In 2 months [place], Malka So, MD [employer] at [place], [facility]            Requested Prescriptions  Pending Prescriptions Disp Refills   SYNTHROID 175 MCG tablet      Sig: Take 1 tablet (175 mcg total) by mouth daily.     Endocrinology:  Hypothyroid Agents Failed - 08/05/2018 10:27 AM      Failed - TSH needs to be rechecked within 3 months after an abnormal result. Refill until TSH is due.      Passed - TSH in normal range and within 360 days    TSH  Date Value Ref Range Status  10/13/2017 2.20 0.35 - 4.50 uIU/mL Final         Passed - Valid encounter within last 12 months    Recent Outpatient Visits          8 months  ago Sore throat   [place] [facility] [place], Malka So, MD   9 months ago Routine general medical examination at a health care facility   [facility] [place], Malka So, MD   11 months ago Acute URI   [facility] [place], Charlene Brooke, NP   1 year ago Fatigue, unspecified type   [facility] [place], Malka So, MD      Future Appointments            In 2 months [place], Malka So, MD [employer] at [employer], [facility]

## 2018-08-05 NOTE — Telephone Encounter (Signed)
Copied from [place] [id number]. Topic: Quick Communication - Rx Refill/Question >> Aug 05, 2018  8:41 AM Leward Quan A wrote: Medication: SYNTHROID 175 MCG tablet   Has the patient contacted their pharmacy? Yes.   (Agent: If no, request that the patient contact the pharmacy for the refill.) (Agent: If yes, when and what did the pharmacy advise?)  Preferred Pharmacy (with phone number or street name): [facility] [place], [place] [address] [phone] (Phone) [phone] (Fax)    Agent: Please be advised that RX refills may take up to 3 business days. We ask that you follow-up with your pharmacy.

## 2018-08-06 MED ORDER — SYNTHROID 175 MCG PO TABS: 175.0000 ug | ORAL_TABLET | Freq: Every day | ORAL | 0 refills | Status: DC

## 2018-10-14 ENCOUNTER — Encounter: Payer: 59 | Admitting: Family Medicine

## 2018-10-30 ENCOUNTER — Other Ambulatory Visit: Payer: Self-pay | Admitting: Family Medicine

## 2018-11-04 ENCOUNTER — Ambulatory Visit (INDEPENDENT_AMBULATORY_CARE_PROVIDER_SITE_OTHER): Payer: 59 | Admitting: Family Medicine

## 2018-11-04 ENCOUNTER — Encounter: Payer: Self-pay | Admitting: Family Medicine

## 2018-11-04 VITALS — BP 120/70 | HR 73 | Temp 98.1°F | Resp 12 | Ht 71.0 in | Wt 247.5 lb

## 2018-11-04 DIAGNOSIS — Z1329 Encounter for screening for other suspected endocrine disorder: Secondary | ICD-10-CM | POA: Diagnosis not present

## 2018-11-04 DIAGNOSIS — Z13220 Encounter for screening for lipoid disorders: Secondary | ICD-10-CM | POA: Diagnosis not present

## 2018-11-04 DIAGNOSIS — E039 Hypothyroidism, unspecified: Secondary | ICD-10-CM | POA: Diagnosis not present

## 2018-11-04 DIAGNOSIS — Z13228 Encounter for screening for other metabolic disorders: Secondary | ICD-10-CM | POA: Diagnosis not present

## 2018-11-04 DIAGNOSIS — Z0000 Encounter for general adult medical examination without abnormal findings: Secondary | ICD-10-CM

## 2018-11-04 DIAGNOSIS — Z130 Encounter for screening for diseases of the blood and blood-forming organs and certain disorders involving the immune mechanism: Secondary | ICD-10-CM

## 2018-11-04 DIAGNOSIS — E559 Vitamin D deficiency, unspecified: Secondary | ICD-10-CM | POA: Diagnosis not present

## 2018-11-04 LAB — VITAMIN D 25 HYDROXY (VIT D DEFICIENCY, FRACTURES): VITD: 32.8 ng/mL (ref 30.00–100.00)

## 2018-11-04 LAB — LIPID PANEL
Cholesterol: 161 mg/dL (ref 0–200)
HDL: 63.2 mg/dL (ref >39.00)
LDL Cholesterol: 75 mg/dL (ref 0–99)
NonHDL: 97.62
Total CHOL/HDL Ratio: 3
Triglycerides: 114 mg/dL (ref 0.0–149.0)
VLDL: 22.8 mg/dL (ref 0.0–40.0)

## 2018-11-04 LAB — BASIC METABOLIC PANEL
BUN: 20 mg/dL (ref 6–23)
CO2: 32 mEq/L (ref 19–32)
Calcium: 9.4 mg/dL (ref 8.4–10.5)
Chloride: 101 mEq/L (ref 96–112)
Creatinine, Ser: 0.93 mg/dL (ref 0.40–1.50)
GFR: 86.15 mL/min (ref >60.00)
GLUCOSE: 80 mg/dL (ref 70–99)
POTASSIUM: 5 meq/L (ref 3.5–5.1)
Sodium: 138 mEq/L (ref 135–145)

## 2018-11-04 LAB — TSH: TSH: 1.59 u[IU]/mL (ref 0.35–4.50)

## 2018-11-04 NOTE — Patient Instructions (Signed)
A few things to remember from today's visit:   Routine general medical examination at a health care facility  Vitamin D deficiency, unspecified - Plan: VITAMIN D 25 Hydroxy (Vit-D Deficiency, Fractures)  Hypothyroidism, unspecified type - Plan: TSH  Screening for lipoid disorders - Plan: Lipid panel  Screening for endocrine, metabolic and immunity disorder - Plan: Basic metabolic panel   At least 150 minutes of moderate exercise per week, daily brisk walking for 15-30 min is a good exercise option. Healthy diet low in saturated (animal) fats and sweets and consisting of fresh fruits and vegetables, lean meats such as fish and white chicken and whole grains.  - Vaccines:  Tdap vaccine every 10 years.  Shingles vaccine recommended at age 61, could be given after 50 years of age but not sure about insurance coverage.  Pneumonia vaccines:  Prevnar 83 at 5 and Pneumovax at 22.   -Screening recommendations for low/normal risk males:  Screening for diabetes at age 36 and every 3 years. Earlier screening if cardiovascular risk factors.   Lipid screening at 35 and every 3 years. Screening starts in younger males with cardiovascular risk factors.  Colon cancer screening at age 7 and until age 39. Colonoscopy is the ideal, if normal it can be repeated every 10 years.  Immunochemical test (FIT): Hemoccult cards, very sensitive if all 3 cards are done. If negative it can be repeated annually. If positive a colonoscopy should follow.   Cologuard: Intended for patients with average risk factors for colon cancer from 41-58 years old. Anyone with prior history of colon polyps or family history of colonic neoplasia is NOT average risk. May be repeated every 3 years.  It is not as sensitive in older patients. Cost is around  $ 649. If positive a diagnostic colonoscopy must be done.     Prostate cancer screening: some controversy, starts usually at 16: Rectal exam and PSA.  Aortic  Abdominal Aneurism once between 67 and 34 years old if ever smoker.  Also recommended:  1. Dental visit- Brush and floss your teeth twice daily; visit your dentist twice a year. 2. Eye doctor- Get an eye exam at least every 2 years. 3. Helmet use- Always wear a helmet when riding a bicycle, motorcycle, rollerblading or skateboarding. 4. Safe sex- If you may be exposed to sexually transmitted infections, use a condom. 5. Seat belts- Seat belts can save your live; always wear one. 6. Smoke/Carbon Monoxide detectors- These detectors need to be installed on the appropriate level of your home. Replace batteries at least once a year. 7. Skin cancer- When out in the sun please cover up and use sunscreen 15 SPF or higher. 8. Violence- If anyone is threatening or hurting you, please tell your healthcare provider.  9. Drink alcohol in moderation- Limit alcohol intake to one drink or less per day. Never drink and drive.   Please be sure medication list is accurate. If a new problem present, please set up appointment sooner than planned today.

## 2018-11-04 NOTE — Assessment & Plan Note (Signed)
Continue ergocalciferol 50,000 units every 2 weeks. We will adjust treatment according to 25 OH vitamin D results.

## 2018-11-04 NOTE — Progress Notes (Signed)
HPI:  [name] is a 48 y.o.male here today for his routine physical examination.  Last CPE: 10/13/2017 He lives with his wife and their 3 children.  Regular exercise 3 or more times per week: He does play basketball with his children P weight. Following a healthy diet: Yes   Chronic medical problems: Vitamin D deficiency, hypothyroidism, cervicalgia (s/p discectomy in 2013), seasonal allergies, and chronic fatigue. He follows with endocrinologist, Dr. Elyse Hsu but has not seen him in over a year.  He would like for me to check TSH with his labs today. Negative for worsening fatigue, abnormal weight loss, palpitations, tremor, or cold/heat intolerance.   Immunization History  Administered Date(s) Administered  . Tdap 10/13/2017    -Denies high alcohol intake, tobacco use, or Hx of illicit drug use.  Vitamin D deficiency: Currently he is on ergocalciferol 50,000 units every 3 weeks. OTC vitamin D causes some side effects. He has tolerated medication well, denies side effects.  Hypothyroidism, currently he is on levothyroxine 175 mcg daily. Last TSH in 10/2017 was normal at 2.2.   Review of Systems  Constitutional: Negative for activity change, appetite change, fatigue, fever and unexpected weight change.  HENT: Negative for dental problem, nosebleeds, sore throat, trouble swallowing and voice change.   Eyes: Negative for redness and visual disturbance.  Respiratory: Negative for cough, shortness of breath and wheezing.   Cardiovascular: Negative for chest pain, palpitations and leg swelling.  Gastrointestinal: Negative for abdominal pain, blood in stool, nausea and vomiting.  Endocrine: Negative for heat intolerance, polydipsia, polyphagia and polyuria.  Genitourinary: Negative for decreased urine volume, dysuria, genital sores, hematuria and testicular pain.  Musculoskeletal: Negative for gait problem and myalgias.  Skin: Negative for color change and  rash.  Neurological: Negative for syncope, weakness and headaches.  Hematological: Negative for adenopathy. Does not bruise/bleed easily.  Psychiatric/Behavioral: Negative for confusion and sleep disturbance. The patient is not nervous/anxious.      Current Outpatient Medications on File Prior to Visit  Medication Sig Dispense Refill  . fluticasone (FLONASE) 50 MCG/ACT nasal spray Place 1 spray into both nostrils 2 (two) times daily. (Patient taking differently: Place 1 spray into both nostrils as needed. ) 16 g 1  . SYNTHROID 175 MCG tablet Take 1 tablet (175 mcg total) by mouth daily. 90 tablet 0  . Vitamin D, Ergocalciferol, (DRISDOL) 50000 units CAPS capsule 1 cap every 3 weeks. 6 capsule 3   No current facility-administered medications on file prior to visit.      Past Medical History:  Diagnosis Date  . Allergy   . Arthritis   . Chicken pox   . Hyperbilirubinemia   . Rosacea, unspecified    Follows with dermatologists.  . Thyroid disease     Past Surgical History:  Procedure Laterality Date  . TONSILLECTOMY      No Known Allergies  Family History  Problem Relation Age of Onset  . Diabetes Mother   . Hypertension Mother   . Atrial fibrillation Mother   . Multiple sclerosis Father   . Cerebral palsy Sister   . Atrial fibrillation Brother     Social History   Socioeconomic History  . Marital status: Married    Spouse name: Not on file  . Number of children: Not on file  . Years of education: Not on file  . Highest education level: Not on file  Occupational History  . Not on file  Social Needs  . Financial  resource strain: Not on file  . Food insecurity:    Worry: Not on file    Inability: Not on file  . Transportation needs:    Medical: Not on file    Non-medical: Not on file  Tobacco Use  . Smoking status: Never Smoker  . Smokeless tobacco: Never Used  Substance and Sexual Activity  . Alcohol use: Yes  . Drug use: No  . Sexual activity: Yes     Partners: Female  Lifestyle  . Physical activity:    Days per week: Not on file    Minutes per session: Not on file  . Stress: Not on file  Relationships  . Social connections:    Talks on phone: Not on file    Gets together: Not on file    Attends religious service: Not on file    Active member of club or organization: Not on file    Attends meetings of clubs or organizations: Not on file    Relationship status: Not on file  Other Topics Concern  . Not on file  Social History Narrative  . Not on file     Vitals:   11/04/18 0858  BP: 120/70  Pulse: 73  Resp: 12  Temp: 98.1 F (36.7 C)  SpO2: 96%   Body mass index is 34.52 kg/m.   Wt Readings from Last 3 Encounters:  11/04/18 247 lb 8 oz (112.3 kg)  11/26/17 239 lb 4 oz (108.5 kg)  10/13/17 240 lb 2 oz (108.9 kg)    Physical Exam  Nursing note and vitals reviewed. Constitutional: He is oriented to person, place, and time. He appears well-developed. No distress.  HENT:  Head: Normocephalic and atraumatic.  Right Ear: Tympanic membrane, external ear and ear canal normal.  Left Ear: Tympanic membrane, external ear and ear canal normal.  Mouth/Throat: Oropharynx is clear and moist and mucous membranes are normal.  Eyes: Pupils are equal, round, and reactive to light. Conjunctivae and EOM are normal.  Neck: Normal range of motion. No tracheal deviation present. No thyromegaly present.  Cardiovascular: Normal rate and regular rhythm.  No murmur heard. Pulses:      Dorsalis pedis pulses are 2+ on the right side and 2+ on the left side.  Respiratory: Effort normal and breath sounds normal. No respiratory distress.  GI: Soft. He exhibits no mass. There is no hepatomegaly. There is no abdominal tenderness.  Genitourinary:    Genitourinary Comments: Refused,no concerns.   Musculoskeletal:        General: No tenderness or edema.     Comments: No major deformities appreciated and no signs of synovitis.  Lymphadenopathy:     He has no cervical adenopathy.       Right: No supraclavicular adenopathy present.       Left: No supraclavicular adenopathy present.  Neurological: He is alert and oriented to person, place, and time. He has normal strength. No cranial nerve deficit or sensory deficit. Coordination and gait normal.  Reflex Scores:      Bicep reflexes are 2+ on the right side and 2+ on the left side.      Patellar reflexes are 2+ on the right side and 2+ on the left side. Skin: Skin is warm. No erythema.  Psychiatric: He has a normal mood and affect. Cognition and memory are normal.  Well-groomed, good eye contact.    ASSESSMENT AND PLAN:  Mr. Eon was seen today for annual exam.  Orders Placed This Encounter  Procedures  .  VITAMIN D 25 Hydroxy (Vit-D Deficiency, Fractures)  . Basic metabolic panel  . Lipid panel  . TSH   Lab Results  Component Value Date   TSH 1.59 11/04/2018   Lab Results  Component Value Date   CREATININE 0.93 11/04/2018   BUN 20 11/04/2018   NA 138 11/04/2018   K 5.0 11/04/2018   CL 101 11/04/2018   CO2 32 11/04/2018   Lab Results  Component Value Date   CHOL 161 11/04/2018   HDL 63.20 11/04/2018   LDLCALC 75 11/04/2018   TRIG 114.0 11/04/2018   CHOLHDL 3 11/04/2018     Routine general medical examination at a health care facility We discussed the importance of regular physical activity and healthy diet for prevention of chronic illness and/or complications. Preventive guidelines reviewed. Vaccination up-to-date. He will be due for colon cancer screening in 04/2019.  We discussed available options, he will let me know which one he will be interested in doing. Next CPE in a year.  Screening for lipoid disorders -     Lipid panel  Screening for endocrine, metabolic and immunity disorder -     Basic metabolic panel  Hypothyroidism, unspecified No changes in current management, will follow TSH done today and will give further recommendations  accordingly.   Vitamin D deficiency, unspecified Continue ergocalciferol 50,000 units every 2 weeks. We will adjust treatment according to 25 OH vitamin D results.    Return in 1 year (on 11/04/2019) for cpe.    Zana Biancardi G. [place], MD  [facility]. [place] office.

## 2018-11-04 NOTE — Assessment & Plan Note (Signed)
No changes in current management, will follow TSH done today and will give further recommendations accordingly.  

## 2018-11-06 ENCOUNTER — Encounter: Payer: Self-pay | Admitting: Family Medicine

## 2018-11-06 ENCOUNTER — Other Ambulatory Visit: Payer: Self-pay | Admitting: Family Medicine

## 2018-11-06 MED ORDER — SYNTHROID 175 MCG PO TABS: 175.0000 ug | ORAL_TABLET | Freq: Every day | ORAL | 0 refills | Status: DC

## 2018-11-06 MED ORDER — VITAMIN D (ERGOCALCIFEROL) 1.25 MG (50000 UNIT) PO CAPS: ORAL_CAPSULE | ORAL | 3 refills | Status: DC

## 2018-11-06 NOTE — Telephone Encounter (Signed)
PCP has not looked at labs yet, will call with results when she does.

## 2018-11-06 NOTE — Telephone Encounter (Signed)
Copied from [place] [phone]. Topic: General - Other >> Nov 06, 2018 11:03 AM Jodie Echevaria wrote: Reason for CRM: Patient called to say that he was seen on 11/04/2018 had blood work done. Patient took last SYNTHROID 175 MCG tablet this a.m. and is in need of a new Rx sent to pharmacy also would like a calll back today regarding his blood work. Please contact patient at Ph# [phone]

## 2018-11-06 NOTE — Telephone Encounter (Signed)
Copied from [place] [phone]. Topic: Quick Communication - Rx Refill/Question >> Nov 06, 2018 11:06 AM Leward Quan A wrote: Medication: SYNTHROID 175 MCG tablet   Has the patient contacted their pharmacy? Yes.   (Agent: If no, request that the patient contact the pharmacy for the refill.) (Agent: If yes, when and what did the pharmacy advise?)  Preferred Pharmacy (with phone number or street name): [facility] [place], [place] [address] [phone] (Phone) [phone] (Fax)    Agent: Please be advised that RX refills may take up to 3 business days. We ask that you follow-up with your pharmacy.

## 2018-12-02 ENCOUNTER — Encounter: Payer: 59 | Admitting: Family Medicine

## 2019-01-05 ENCOUNTER — Encounter: Payer: Self-pay | Admitting: Family Medicine

## 2019-01-05 ENCOUNTER — Ambulatory Visit (INDEPENDENT_AMBULATORY_CARE_PROVIDER_SITE_OTHER): Payer: 59 | Admitting: Family Medicine

## 2019-01-05 ENCOUNTER — Other Ambulatory Visit: Payer: Self-pay

## 2019-01-05 VITALS — Resp 12

## 2019-01-05 DIAGNOSIS — S80861A Insect bite (nonvenomous), right lower leg, initial encounter: Secondary | ICD-10-CM

## 2019-01-05 DIAGNOSIS — L03115 Cellulitis of right lower limb: Secondary | ICD-10-CM

## 2019-01-05 DIAGNOSIS — W57XXXA Bitten or stung by nonvenomous insect and other nonvenomous arthropods, initial encounter: Secondary | ICD-10-CM | POA: Diagnosis not present

## 2019-01-05 MED ORDER — TRIAMCINOLONE ACETONIDE 0.1 % EX CREA: 1.0000 "application " | TOPICAL_CREAM | Freq: Two times a day (BID) | CUTANEOUS | 0 refills | Status: AC

## 2019-01-05 MED ORDER — DOXYCYCLINE HYCLATE 100 MG PO TABS: 100.0000 mg | ORAL_TABLET | Freq: Two times a day (BID) | ORAL | 0 refills | Status: AC

## 2019-01-05 NOTE — Progress Notes (Signed)
Virtual Visit via Video Note   I connected with Daniel Juarez on 01/05/19 at 12:00 PM EDT by a video enabled telemedicine application and verified that I am speaking with the correct person using two identifiers.  Location patient: home Location provider:home office Persons participating in the virtual visit: patient, provider  I discussed the limitations of evaluation and management by telemedicine and the availability of in person appointments.He expressed understanding and agreed to proceed.   HPI: Daniel Juarez is a 48 yo male concerned about erythematous area behind right knee.  2 days ago he started with pruritus behind right knee, he noticed some bite marks, and assumed it was a mosquito bite. Yesterday morning he noted erythema and edema around affected area and it seems to be spreading down to calf and to distal aspect of right thigh. Mild soreness.  Today he has noticed some improvement.  Constant pruritus, it interferes with sleep.  He has not identified exacerbating or alleviating factors.  He has used OTC Neosporin and he took a antihistaminic. His wife look at the area to be sure it was not a tick embedded.  He denies fever, chills, body aches, unusual fatigue, chest pain, palpitations, dyspnea, abdominal pain, or arthralgias.  Negative for numbness or tingling.  ROS: See pertinent positives and negatives per HPI.   Past Medical History:  Diagnosis Date   Allergy    Arthritis    Chicken pox    Hyperbilirubinemia    Rosacea, unspecified    Follows with dermatologists.   Thyroid disease     Past Surgical History:  Procedure Laterality Date   TONSILLECTOMY      Family History  Problem Relation Age of Onset   Diabetes Mother    Hypertension Mother    Atrial fibrillation Mother    Multiple sclerosis Father    Cerebral palsy Sister    Atrial fibrillation Brother     Social History   Socioeconomic History   Marital status: Married    Spouse  name: Not on file   Number of children: Not on file   Years of education: Not on file   Highest education level: Not on file  Occupational History   Not on file  Social Needs   Financial resource strain: Not on file   Food insecurity:    Worry: Not on file    Inability: Not on file   Transportation needs:    Medical: Not on file    Non-medical: Not on file  Tobacco Use   Smoking status: Never Smoker   Smokeless tobacco: Never Used  Substance and Sexual Activity   Alcohol use: Yes   Drug use: No   Sexual activity: Yes    Partners: Female  Lifestyle   Physical activity:    Days per week: Not on file    Minutes per session: Not on file   Stress: Not on file  Relationships   Social connections:    Talks on phone: Not on file    Gets together: Not on file    Attends religious service: Not on file    Active member of club or organization: Not on file    Attends meetings of clubs or organizations: Not on file    Relationship status: Not on file   Intimate partner violence:    Fear of current or ex partner: Not on file    Emotionally abused: Not on file    Physically abused: Not on file    Forced sexual activity:  Not on file  Other Topics Concern   Not on file  Social History Narrative   Not on file      Current Outpatient Medications:    doxycycline (VIBRA-TABS) 100 MG tablet, Take 1 tablet (100 mg total) by mouth 2 (two) times daily for 7 days., Disp: 14 tablet, Rfl: 0   fluticasone (FLONASE) 50 MCG/ACT nasal spray, Place 1 spray into both nostrils 2 (two) times daily. (Patient taking differently: Place 1 spray into both nostrils as needed. ), Disp: 16 g, Rfl: 1   SYNTHROID 175 MCG tablet, Take 1 tablet (175 mcg total) by mouth daily., Disp: 90 tablet, Rfl: 0   triamcinolone cream (KENALOG) 0.1 %, Apply 1 application topically 2 (two) times daily for 14 days., Disp: 28 g, Rfl: 0   Vitamin D, Ergocalciferol, (DRISDOL) 1.25 MG (50000 UT) CAPS  capsule, 1 cap every 3 weeks., Disp: 6 capsule, Rfl: 3  EXAM:  VITALS per patient if applicable:Resp 12   GENERAL: alert, oriented, appears well and in no acute distress  HEENT: atraumatic, conjunctiva clear, no obvious facial abnormalities on inspection.  NECK: normal movements of the head and neck  LUNGS: on inspection no signs of respiratory distress, breathing rate appears normal, no obvious gross SOB, gasping or wheezing  CV: no obvious cyanosis  MS: moves all visible extremities without noticeable abnormality  SKIN: Right LE,popliteal area with minimal edema and macular erythema. There is a small hyperpigmented area (bite marks?) in the center. See picture.  PSYCH/NEURO: pleasant and cooperative, no obvious depression or anxiety, speech and thought processing grossly intact     ASSESSMENT AND PLAN:  Discussed the following assessment and plan:  Insect bite of right lower leg, initial encounter - Plan: triamcinolone cream (KENALOG) 0.1 % Symptoms could be related with local reaction. Topical steroid cream was recommended to help with pruritus, triamcinolone twice daily for up to 14 days. Prevention of future insect bites, repellent can be used when going outdoors. Recommend keeping area clean with soap and water and avoid scratching.  Cellulitis of right lower extremity - Plan: doxycycline (VIBRA-TABS) 100 MG tablet Stop topical Neosporin. Doxycycline 100 mg twice daily for 7 days recommended. Instructed about warning signs.     I discussed the assessment and treatment plan with the patient. He was provided an opportunity to ask questions and all were answered. The patient agreed with the plan and demonstrated an understanding of the instructions.   He was advised to call back or seek an in-person evaluation if the symptoms worsen or if the condition fails to improve as anticipated.  Return if symptoms worsen or fail to improve.    Metha Kolasa [place], MD

## 2019-01-06 ENCOUNTER — Encounter: Payer: Self-pay | Admitting: Family Medicine

## 2019-01-30 ENCOUNTER — Other Ambulatory Visit: Payer: Self-pay | Admitting: Family Medicine

## 2019-04-27 ENCOUNTER — Other Ambulatory Visit: Payer: Self-pay | Admitting: Family Medicine

## 2019-06-04 ENCOUNTER — Other Ambulatory Visit: Payer: Self-pay | Admitting: Registered"

## 2019-06-04 DIAGNOSIS — Z20822 Contact with and (suspected) exposure to covid-19: Secondary | ICD-10-CM

## 2019-06-05 LAB — NOVEL CORONAVIRUS, NAA: SARS-CoV-2, NAA: NOT DETECTED

## 2019-07-19 ENCOUNTER — Other Ambulatory Visit: Payer: Self-pay | Admitting: Family Medicine

## 2019-09-07 ENCOUNTER — Encounter: Payer: Self-pay | Admitting: Family Medicine

## 2019-09-08 ENCOUNTER — Encounter: Payer: Self-pay | Admitting: Gastroenterology

## 2019-09-08 ENCOUNTER — Other Ambulatory Visit: Payer: Self-pay | Admitting: Family Medicine

## 2019-09-08 DIAGNOSIS — Z1211 Encounter for screening for malignant neoplasm of colon: Secondary | ICD-10-CM

## 2019-09-22 ENCOUNTER — Encounter: Payer: Self-pay | Admitting: Gastroenterology

## 2019-09-22 ENCOUNTER — Ambulatory Visit (AMBULATORY_SURGERY_CENTER): Payer: Self-pay | Admitting: *Deleted

## 2019-09-22 ENCOUNTER — Other Ambulatory Visit: Payer: Self-pay

## 2019-09-22 VITALS — Temp 97.5°F | Ht 71.0 in | Wt 236.8 lb

## 2019-09-22 DIAGNOSIS — Z01818 Encounter for other preprocedural examination: Secondary | ICD-10-CM

## 2019-09-22 DIAGNOSIS — Z1211 Encounter for screening for malignant neoplasm of colon: Secondary | ICD-10-CM

## 2019-09-22 MED ORDER — SUPREP BOWEL PREP KIT 17.5-3.13-1.6 GM/177ML PO SOLN: ORAL | 0 refills | Status: DC

## 2019-09-22 NOTE — Progress Notes (Signed)
Pt is aware that care partner will wait in the car during procedure; if they feel like they will be too hot or cold to wait in the car; they may wait in the 4 th floor lobby. Patient is aware to bring only one care partner. We want them to wear a mask (we do not have any that we can provide them), practice social distancing, and we will check their temperatures when they get here.  I did remind the patient that their care partner needs to stay in the parking lot the entire time and have a cell phone available, we will call them when the pt is ready for discharge. Patient will wear mask into building.  No egg or soy allergy  No home oxygen use or problems with anesthesia  No medications for weight loss taken  emmi information given  $15 off Suprep coupon given  covid test- 09-29-19 at 1:10 p.m.

## 2019-09-29 ENCOUNTER — Other Ambulatory Visit: Payer: Self-pay | Admitting: Gastroenterology

## 2019-09-29 ENCOUNTER — Ambulatory Visit (INDEPENDENT_AMBULATORY_CARE_PROVIDER_SITE_OTHER): Payer: 59

## 2019-09-29 ENCOUNTER — Telehealth: Payer: Self-pay | Admitting: Gastroenterology

## 2019-09-29 DIAGNOSIS — Z1159 Encounter for screening for other viral diseases: Secondary | ICD-10-CM

## 2019-09-29 NOTE — Telephone Encounter (Signed)
Pt is scheduled for a 10/04/19 colonoscopy.  Pharmacy informed that insurance prefers either [place] or [id number].

## 2019-09-29 NOTE — Telephone Encounter (Signed)
Spoke with pharmacy Suprep is [phone]. Gave this universal Coupon=BIN: [id number] PCN: CN GROUP: [id number] and now the price is $50. Pharmacy will go ahead and fill this for the patient.

## 2019-09-30 LAB — SARS CORONAVIRUS 2 (TAT 6-24 HRS): SARS Coronavirus 2: NEGATIVE

## 2019-10-04 ENCOUNTER — Encounter: Payer: Self-pay | Admitting: Gastroenterology

## 2019-10-04 ENCOUNTER — Ambulatory Visit (AMBULATORY_SURGERY_CENTER): Payer: 59 | Admitting: Gastroenterology

## 2019-10-04 ENCOUNTER — Other Ambulatory Visit: Payer: Self-pay

## 2019-10-04 VITALS — BP 108/64 | HR 57 | Temp 96.2°F | Resp 12 | Ht 71.0 in | Wt 236.8 lb

## 2019-10-04 DIAGNOSIS — K635 Polyp of colon: Secondary | ICD-10-CM | POA: Diagnosis not present

## 2019-10-04 DIAGNOSIS — D123 Benign neoplasm of transverse colon: Secondary | ICD-10-CM

## 2019-10-04 DIAGNOSIS — Z1211 Encounter for screening for malignant neoplasm of colon: Secondary | ICD-10-CM

## 2019-10-04 MED ORDER — SODIUM CHLORIDE 0.9 % IV SOLN: 500.0000 mL | Freq: Once | INTRAVENOUS | Status: DC

## 2019-10-04 NOTE — Progress Notes (Signed)
A and O x3. Report to RN. Tolerated MAC anesthesia well.

## 2019-10-04 NOTE — Patient Instructions (Signed)
YOU HAD AN ENDOSCOPIC PROCEDURE TODAY AT [facility]:   Refer to the procedure report that was given to you for any specific questions about what was found during the examination.  If the procedure report does not answer your questions, please call your gastroenterologist to clarify.  If you requested that your care partner not be given the details of your procedure findings, then the procedure report has been included in a sealed envelope for you to review at your convenience later.  YOU SHOULD EXPECT: Some feelings of bloating in the abdomen. Passage of more gas than usual.  Walking can help get rid of the air that was put into your GI tract during the procedure and reduce the bloating. If you had a lower endoscopy (such as a colonoscopy or flexible sigmoidoscopy) you may notice spotting of blood in your stool or on the toilet paper. If you underwent a bowel prep for your procedure, you may not have a normal bowel movement for a few days.  Please Note:  You might notice some irritation and congestion in your nose or some drainage.  This is from the oxygen used during your procedure.  There is no need for concern and it should clear up in a day or so.  SYMPTOMS TO REPORT IMMEDIATELY:   Following lower endoscopy (colonoscopy or flexible sigmoidoscopy):  Excessive amounts of blood in the stool  Significant tenderness or worsening of abdominal pains  Swelling of the abdomen that is new, acute  Fever of 100F or higher    For urgent or emergent issues, a gastroenterologist can be reached at any hour by calling [phone].   DIET:  We do recommend a small meal at first, but then you may proceed to your regular diet.  Drink plenty of fluids but you should avoid alcoholic beverages for 24 hours.  ACTIVITY:  You should plan to take it easy for the rest of today and you should NOT DRIVE or use heavy machinery until tomorrow (because of the sedation medicines used during the test).     FOLLOW UP: Our staff will call the number listed on your records 48-72 hours following your procedure to check on you and address any questions or concerns that you may have regarding the information given to you following your procedure. If we do not reach you, we will leave a message.  We will attempt to reach you two times.  During this call, we will ask if you have developed any symptoms of COVID 19. If you develop any symptoms (ie: fever, flu-like symptoms, shortness of breath, cough etc.) before then, please call [phone].  If you test positive for Covid 19 in the 2 weeks post procedure, please call and report this information to [place].    If any biopsies were taken you will be contacted by phone or by letter within the next 1-3 weeks.  Please call us at [phone] if you have not heard about the biopsies in 3 weeks.    SIGNATURES/CONFIDENTIALITY: You and/or your care partner have signed paperwork which will be entered into your electronic medical record.  These signatures attest to the fact that that the information above on your After Visit Summary has been reviewed and is understood.  Full responsibility of the confidentiality of this discharge information lies with you and/or your care-partner.   Resume medications. Information given on polyps,diverticulosis and hemorrhoids.

## 2019-10-04 NOTE — Progress Notes (Signed)
Called to room to assist during endoscopic procedure.  Patient ID and intended procedure confirmed with present staff. Received instructions for my participation in the procedure from the performing physician.  

## 2019-10-04 NOTE — Progress Notes (Signed)
Pt's states no medical or surgical changes since previsit or office visit.  Temp-jb  V/s-dt

## 2019-10-04 NOTE — Op Note (Signed)
[place] Patient Name: Daniel Juarez Procedure Date: 10/04/2019 11:17 AM MRN: [id number] Endoscopist: Remo Lipps P. Havery Moros , MD Age: 50 Referring MD:  Date of Birth: 05-03-1971 Gender: Male Account #: [id number] Procedure:                Colonoscopy Indications:              Screening for colorectal malignant neoplasm, This                            is the patient's first colonoscopy Medicines:                Monitored Anesthesia Care Procedure:                Pre-Anesthesia Assessment:                           - Prior to the procedure, a History and Physical                            was performed, and patient medications and                            allergies were reviewed. The patient's tolerance of                            previous anesthesia was also reviewed. The risks                            and benefits of the procedure and the sedation                            options and risks were discussed with the patient.                            All questions were answered, and informed consent                            was obtained. Prior Anticoagulants: The patient has                            taken no previous anticoagulant or antiplatelet                            agents. ASA Grade Assessment: II - A patient with                            mild systemic disease. After reviewing the risks                            and benefits, the patient was deemed in                            satisfactory condition to undergo the procedure.  After obtaining informed consent, the colonoscope                            was passed under direct vision. Throughout the                            procedure, the patient's blood pressure, pulse, and                            oxygen saturations were monitored continuously. The                            Colonoscope was introduced through the anus and                            advanced to the the cecum,  identified by                            appendiceal orifice and ileocecal valve. The                            colonoscopy was performed without difficulty. The                            patient tolerated the procedure well. The quality                            of the bowel preparation was good. The ileocecal                            valve, appendiceal orifice, and rectum were                            photographed. Scope In: 11:30:57 AM Scope Out: 11:49:21 AM Scope Withdrawal Time: 0 hours 15 minutes 11 seconds  Total Procedure Duration: 0 hours 18 minutes 24 seconds  Findings:                 The perianal and digital rectal examinations were                            normal.                           A 4 mm polyp was found in the transverse colon. The                            polyp was flat. The polyp was removed with a cold                            snare. Resection and retrieval were complete.                           A single medium-mouthed diverticulum was found in  the transverse colon.                           Internal hemorrhoids were found during retroflexion.                           The exam was otherwise without abnormality. Complications:            No immediate complications. Estimated blood loss:                            Minimal. Estimated Blood Loss:     Estimated blood loss was minimal. Impression:               - One 4 mm polyp in the transverse colon, removed                            with a cold snare. Resected and retrieved.                           - Diverticulosis in the transverse colon.                           - Internal hemorrhoids.                           - The examination was otherwise normal. Recommendation:           - Patient has a contact number available for                            emergencies. The signs and symptoms of potential                            delayed complications were discussed with the                             patient. Return to normal activities tomorrow.                            Written discharge instructions were provided to the                            patient.                           - Resume previous diet.                           - Continue present medications.                           - Await pathology results. Remo Lipps P. Artasia Thang, MD 10/04/2019 11:55:38 AM This report has been signed electronically.

## 2019-10-06 ENCOUNTER — Telehealth: Payer: Self-pay

## 2019-10-06 NOTE — Telephone Encounter (Signed)
  Follow up Call-  Call back number 10/04/2019  Post procedure Call Back phone  # [phone]  Permission to leave phone message Yes  Some recent data might be hidden     Patient questions:  Do you have a fever, pain , or abdominal swelling? No. Pain Score  0 *  Have you tolerated food without any problems? Yes.    Have you been able to return to your normal activities? Yes.    Do you have any questions about your discharge instructions: Diet   No. Medications  No. Follow up visit  No.  Do you have questions or concerns about your Care? No.  Actions: * If pain score is 4 or above: No action needed, pain <4. 1. Have you developed a fever since your procedure? NO  2.   Have you had an respiratory symptoms (SOB or cough) since your procedure? NO  3.   Have you tested positive for COVID 19 since your procedure NO  4.   Have you had any family members/close contacts diagnosed with the COVID 19 since your procedure?  NO   If yes to any of these questions please route to Joylene John, RN and Alphonsa Gin, RN.

## 2019-10-28 ENCOUNTER — Other Ambulatory Visit: Payer: Self-pay | Admitting: Family Medicine

## 2019-11-19 ENCOUNTER — Other Ambulatory Visit: Payer: Self-pay

## 2019-11-19 ENCOUNTER — Ambulatory Visit (INDEPENDENT_AMBULATORY_CARE_PROVIDER_SITE_OTHER): Payer: 59 | Admitting: Family Medicine

## 2019-11-19 ENCOUNTER — Ambulatory Visit (INDEPENDENT_AMBULATORY_CARE_PROVIDER_SITE_OTHER): Payer: 59

## 2019-11-19 ENCOUNTER — Encounter: Payer: Self-pay | Admitting: Family Medicine

## 2019-11-19 VITALS — BP 126/80 | HR 66 | Temp 96.2°F | Resp 12 | Ht 71.0 in | Wt 232.0 lb

## 2019-11-19 DIAGNOSIS — Z0000 Encounter for general adult medical examination without abnormal findings: Secondary | ICD-10-CM | POA: Diagnosis not present

## 2019-11-19 DIAGNOSIS — Z130 Encounter for screening for diseases of the blood and blood-forming organs and certain disorders involving the immune mechanism: Secondary | ICD-10-CM

## 2019-11-19 DIAGNOSIS — Z13228 Encounter for screening for other metabolic disorders: Secondary | ICD-10-CM

## 2019-11-19 DIAGNOSIS — M25552 Pain in left hip: Secondary | ICD-10-CM | POA: Diagnosis not present

## 2019-11-19 DIAGNOSIS — Z1329 Encounter for screening for other suspected endocrine disorder: Secondary | ICD-10-CM

## 2019-11-19 DIAGNOSIS — E039 Hypothyroidism, unspecified: Secondary | ICD-10-CM

## 2019-11-19 DIAGNOSIS — Z13220 Encounter for screening for lipoid disorders: Secondary | ICD-10-CM

## 2019-11-19 DIAGNOSIS — Z125 Encounter for screening for malignant neoplasm of prostate: Secondary | ICD-10-CM | POA: Diagnosis not present

## 2019-11-19 DIAGNOSIS — E559 Vitamin D deficiency, unspecified: Secondary | ICD-10-CM | POA: Diagnosis not present

## 2019-11-19 LAB — COMPREHENSIVE METABOLIC PANEL
ALT: 22 U/L (ref 0–53)
AST: 18 U/L (ref 0–37)
Albumin: 4.7 g/dL (ref 3.5–5.2)
Alkaline Phosphatase: 61 U/L (ref 39–117)
BUN: 16 mg/dL (ref 6–23)
CO2: 31 mEq/L (ref 19–32)
Calcium: 10 mg/dL (ref 8.4–10.5)
Chloride: 102 mEq/L (ref 96–112)
Creatinine, Ser: 0.78 mg/dL (ref 0.40–1.50)
GFR: 105.09 mL/min (ref >60.00)
Glucose, Bld: 82 mg/dL (ref 70–99)
Potassium: 4.6 mEq/L (ref 3.5–5.1)
Sodium: 139 mEq/L (ref 135–145)
Total Bilirubin: 1.6 mg/dL — ABNORMAL HIGH (ref 0.2–1.2)
Total Protein: 7.4 g/dL (ref 6.0–8.3)

## 2019-11-19 LAB — LIPID PANEL
Cholesterol: 162 mg/dL (ref 0–200)
HDL: 60.7 mg/dL (ref >39.00)
LDL Cholesterol: 77 mg/dL (ref 0–99)
NonHDL: 101.48
Total CHOL/HDL Ratio: 3
Triglycerides: 121 mg/dL (ref 0.0–149.0)
VLDL: 24.2 mg/dL (ref 0.0–40.0)

## 2019-11-19 LAB — CBC
HCT: 46.8 % (ref 39.0–52.0)
Hemoglobin: 15.6 g/dL (ref 13.0–17.0)
MCHC: 33.3 g/dL (ref 30.0–36.0)
MCV: 93.7 fl (ref 78.0–100.0)
Platelets: 218 10*3/uL (ref 150.0–400.0)
RBC: 4.99 Mil/uL (ref 4.22–5.81)
RDW: 13.3 % (ref 11.5–15.5)
WBC: 6.3 10*3/uL (ref 4.0–10.5)

## 2019-11-19 LAB — VITAMIN D 25 HYDROXY (VIT D DEFICIENCY, FRACTURES): VITD: 58.87 ng/mL (ref 30.00–100.00)

## 2019-11-19 LAB — TSH: TSH: 0.72 u[IU]/mL (ref 0.35–4.50)

## 2019-11-19 LAB — PSA: PSA: 0.48 ng/mL (ref 0.10–4.00)

## 2019-11-19 LAB — HEMOGLOBIN A1C: Hgb A1c MFr Bld: 5.7 % (ref 4.6–6.5)

## 2019-11-19 NOTE — Assessment & Plan Note (Signed)
Be careful with vit D dose, it can be toxic. No changes in current management, will follow labs done today and will give further recommendations accordingly.

## 2019-11-19 NOTE — Progress Notes (Signed)
HPI:  Daniel Juarez is a 49 y.o.male here today for his routine physical examination.  Last CPE: 11/04/18 He lives with his wife and 3 children.  Regular exercise 3 or more times per week: He is exercising less then usual due to cold weather. Following a healthy diet: Yes. He is doing intermittent fasting 5 days per week.  Chronic medical problems: Vitamin D deficiency, hypothyroidism, neck pain, chronic fatigue, and seasonal allergies among some.  Immunization History  Administered Date(s) Administered  . Influenza-Unspecified 06/04/2017, 06/10/2018, 06/27/2019  . Tdap 10/13/2017  He got his first COVID 19 vaccine,Phyzer, 11/17/19.  Last colon cancer screening: Colonoscopy on 10/04/2019. Last prostate ca screening: N/A Nocturia x 1.  -Denies high alcohol intake, tobacco use, or Hx of illicit drug use.  -Concerns and/or follow up today:   Left hip pain.For a while, intermittent burning like sensation progressively getting worse. Not sure about exacerbating or alleviating factors. It is not interfering with daily activities. No Hx of trauma.  Hypothyroidism: Dx;ed in 2008. He follows with endocrinologist, Dr.Altheimer. Last visit 1.5 years ago. So he would like to have TSH check. He is on Levothyroxine 175 mcg daily. Negative for changes in bowel habits,cold/heat intolerance, abnormal wt loss.  Vit D deficiency:He is on  Ergocalciferol 50,000 U and added extra a few months ago,4000-5000 U daily.  Review of Systems  Constitutional: Positive for fatigue. Negative for activity change, appetite change and fever.  HENT: Negative for dental problem, nosebleeds, sore throat and trouble swallowing.   Eyes: Negative for redness and visual disturbance.  Respiratory: Negative for cough, shortness of breath and wheezing.   Cardiovascular: Negative for chest pain, palpitations and leg swelling.  Gastrointestinal: Negative for abdominal pain, blood in stool, nausea and  vomiting.  Endocrine: Negative for polydipsia, polyphagia and polyuria.  Genitourinary: Negative for decreased urine volume, dysuria, genital sores, hematuria and testicular pain.  Musculoskeletal: Negative for gait problem and joint swelling.  Skin: Negative for color change and rash.  Allergic/Immunologic: Positive for environmental allergies.  Neurological: Negative for dizziness, syncope, weakness and headaches.  Hematological: Negative for adenopathy. Does not bruise/bleed easily.  Psychiatric/Behavioral: Negative for confusion and sleep disturbance. The patient is not nervous/anxious.   All other systems reviewed and are negative.  Current Outpatient Medications on File Prior to Visit  Medication Sig Dispense Refill  . Cholecalciferol (VITAMIN D3 PO) Take 5,000 Units by mouth daily.    . fluticasone (FLONASE) 50 MCG/ACT nasal spray Place 1 spray into both nostrils 2 (two) times daily. (Patient taking differently: Place 1 spray into both nostrils as needed. ) 16 g 1  . Multiple Vitamin (MULTIVITAMIN PO) Take by mouth daily.    . Multiple Vitamins-Minerals (ZINC PO) Take by mouth daily.    Marland Kitchen SYNTHROID 175 MCG tablet TAKE 1 TABLET BY MOUTH EVERY DAY 90 tablet 0  . Vitamin D, Ergocalciferol, (DRISDOL) 1.25 MG (50000 UT) CAPS capsule 1 cap every 3 weeks. 6 capsule 3   No current facility-administered medications on file prior to visit.     Past Medical History:  Diagnosis Date  . Allergy   . Arthritis   . Chicken pox   . Hyperbilirubinemia   . Rosacea, unspecified    Follows with dermatologists.  . Thyroid disease    hypo    Past Surgical History:  Procedure Laterality Date  . ANTERIOR CERVICAL DECOMP/DISCECTOMY FUSION    . TONSILLECTOMY    . VASECTOMY      No  Known Allergies  Family History  Problem Relation Age of Onset  . Diabetes Mother   . Hypertension Mother   . Atrial fibrillation Mother   . Multiple sclerosis Father   . Cerebral palsy Sister   . Atrial  fibrillation Brother   . Colon cancer Neg Hx   . Esophageal cancer Neg Hx   . Rectal cancer Neg Hx   . Stomach cancer Neg Hx     Social History   Socioeconomic History  . Marital status: Married    Spouse name: Not on file  . Number of children: Not on file  . Years of education: Not on file  . Highest education level: Not on file  Occupational History  . Not on file  Tobacco Use  . Smoking status: Never Smoker  . Smokeless tobacco: Never Used  Substance and Sexual Activity  . Alcohol use: Yes    Comment: occasionally  . Drug use: No  . Sexual activity: Yes    Partners: Female  Other Topics Concern  . Not on file  Social History Narrative  . Not on file   Social Determinants of Health   Financial Resource Strain:   . Difficulty of Paying Living Expenses:   Food Insecurity:   . Worried About Charity fundraiser in the Last Year:   . Arboriculturist in the Last Year:   Transportation Needs:   . Film/video editor (Medical):   Marland Kitchen Lack of Transportation (Non-Medical):   Physical Activity:   . Days of Exercise per Week:   . Minutes of Exercise per Session:   Stress:   . Feeling of Stress :   Social Connections:   . Frequency of Communication with Friends and Family:   . Frequency of Social Gatherings with Friends and Family:   . Attends Religious Services:   . Active Member of Clubs or Organizations:   . Attends Archivist Meetings:   Marland Kitchen Marital Status:     Vitals:   11/19/19 0923  BP: 126/80  Pulse: 66  Resp: 12  Temp: (!) 96.2 F (35.7 C)  SpO2: 99%   Body mass index is 32.36 kg/m.   Wt Readings from Last 3 Encounters:  11/19/19 232 lb (105.2 kg)  10/04/19 236 lb 12.8 oz (107.4 kg)  09/22/19 236 lb 12.8 oz (107.4 kg)   Physical Exam  Nursing note and vitals reviewed. Constitutional: He is oriented to person, place, and time. He appears well-developed. No distress.  HENT:  Head: Atraumatic.  Right Ear: Tympanic membrane, external  ear and ear canal normal.  Left Ear: Tympanic membrane, external ear and ear canal normal.  Mouth/Throat: Oropharynx is clear and moist and mucous membranes are normal.  Eyes: Pupils are equal, round, and reactive to light. Conjunctivae and EOM are normal.  Neck: No tracheal deviation present. No thyromegaly present.  Cardiovascular: Normal rate and regular rhythm.  No murmur heard. Pulses:      Dorsalis pedis pulses are 2+ on the right side and 2+ on the left side.  Respiratory: Effort normal and breath sounds normal. No respiratory distress.  GI: Soft. He exhibits no mass. There is no hepatomegaly. There is no abdominal tenderness.  Genitourinary:    Genitourinary Comments: No concerns.   Musculoskeletal:        General: No tenderness or edema.     Cervical back: Normal range of motion.     Left hip: No tenderness or bony tenderness. Normal range of  motion. Normal strength.       Legs:     Comments: No major deformities appreciated and no signs of synovitis.  Lymphadenopathy:    He has no cervical adenopathy.       Right: No inguinal and no supraclavicular adenopathy present.       Left: No inguinal and no supraclavicular adenopathy present.  Neurological: He is alert and oriented to person, place, and time. He has normal strength. No cranial nerve deficit or sensory deficit. Coordination and gait normal.  Reflex Scores:      Bicep reflexes are 2+ on the right side and 2+ on the left side.      Patellar reflexes are 2+ on the right side and 2+ on the left side. Skin: Skin is warm. No erythema.  Psychiatric: He has a normal mood and affect. Cognition and memory are normal.  Well groomed, good eye contact.   ASSESSMENT AND PLAN:  DanielDaniel Juarez was seen today for annual exam.  Diagnoses and all orders for this visit:  Orders Placed This Encounter  Procedures  . DG Hip Unilat W OR W/O Pelvis 2-3 Views Left  . Comprehensive metabolic panel  . Lipid panel  . Hemoglobin A1c  .  PSA(Must document that pt has been informed of limitations of PSA testing.)  . TSH  . VITAMIN D 25 Hydroxy (Vit-D Deficiency, Fractures)  . CBC   Lab Results  Component Value Date   ALT 22 11/19/2019   AST 18 11/19/2019   ALKPHOS 61 11/19/2019   BILITOT 1.6 (H) 11/19/2019   Lab Results  Component Value Date   CREATININE 0.78 11/19/2019   BUN 16 11/19/2019   NA 139 11/19/2019   K 4.6 11/19/2019   CL 102 11/19/2019   CO2 31 11/19/2019    Lab Results  Component Value Date   WBC 6.3 11/19/2019   HGB 15.6 11/19/2019   HCT 46.8 11/19/2019   MCV 93.7 11/19/2019   PLT 218.0 11/19/2019   Lab Results  Component Value Date   CHOL 162 11/19/2019   HDL 60.70 11/19/2019   LDLCALC 77 11/19/2019   TRIG 121.0 11/19/2019   CHOLHDL 3 11/19/2019   Lab Results  Component Value Date   TSH 0.72 11/19/2019   Lab Results  Component Value Date   PSA 0.48 11/19/2019   Lab Results  Component Value Date   HGBA1C 5.7 11/19/2019    Routine general medical examination at a health care facility We discussed the importance of regular physical activity and healthy diet for prevention of chronic illness and/or complications. Preventive guidelines reviewed. Vaccination up to date.  Next CPE in a year.  Prostate cancer screening -     PSA(Must document that pt has been informed of limitations of PSA testing.)  Screening for lipoid disorders -     Lipid panel  Screening for endocrine, metabolic and immunity disorder -     Comprehensive metabolic panel -     Hemoglobin A1c  Hypothyroidism, unspecified Continue Levothyroxine 175 mcg daily. I will give further recommendations according to TSH results.   Vitamin D deficiency, unspecified Be careful with vit D dose, it can be toxic. No changes in current management, will follow labs done today and will give further recommendations accordingly.   Hip pain, left Pain not reproducible on examination. ? Tendinitis. He refers to hold  on referral for PT or ortho. Monitor for new symptoms. Hip X ray ordered today.   Return in 1 year (on 11/18/2020).  Jaymarie Yeakel G. [place], MD  [facility]. [place] office.

## 2019-11-19 NOTE — Assessment & Plan Note (Signed)
Pain not reproducible on examination. ? Tendinitis. He refers to hold on referral for PT or ortho. Monitor for new symptoms. Hip X ray ordered today.

## 2019-11-19 NOTE — Patient Instructions (Signed)
A few things to remember from today's visit:   Routine general medical examination at a health care facility  Hypothyroidism, unspecified type - Plan: TSH  Vitamin D deficiency, unspecified - Plan: VITAMIN D 25 Hydroxy (Vit-D Deficiency, Fractures)  Hip pain, left - Plan: DG Hip Unilat W OR W/O Pelvis 2-3 Views Left, CBC  Prostate cancer screening - Plan: PSA(Must document that pt has been informed of limitations of PSA testing.)  Screening for lipoid disorders - Plan: Lipid panel  Screening for endocrine, metabolic and immunity disorder - Plan: Comprehensive metabolic panel, Hemoglobin A1c  No changes in medications today. I am checking vit D and thyroid. Hip pain could be tendinitis.Let me know if you want to see ortho.   At least 150 minutes of moderate exercise per week, daily brisk walking for 15-30 min is a good exercise option. Healthy diet low in saturated (animal) fats and sweets and consisting of fresh fruits and vegetables, lean meats such as fish and white chicken and whole grains.  - Vaccines:  Tdap vaccine every 10 years.  Shingles vaccine recommended at age 51, could be given after 51 years of age but not sure about insurance coverage.  Pneumonia vaccines: Pneumovax at 53. -Screening recommendations for low/normal risk males:  Screening for diabetes at age 52 and every 3 years. Earlier screening if cardiovascular risk factors.   Lipid screening at 35 and every 3 years. Screening starts in younger males with cardiovascular risk factors.  Colon cancer screening at age 74 and until age 59.  Prostate cancer screening: some controversy, starts usually at 58: Rectal exam and PSA.  Aortic Abdominal Aneurism once between 82 and 17 years old if ever smoker.  Also recommended:  1. Dental visit- Brush and floss your teeth twice daily; visit your dentist twice a year. 2. Eye doctor- Get an eye exam at least every 2 years. 3. Helmet use- Always wear a helmet when  riding a bicycle, motorcycle, rollerblading or skateboarding. 4. Safe sex- If you may be exposed to sexually transmitted infections, use a condom. 5. Seat belts- Seat belts can save your live; always wear one. 6. Smoke/Carbon Monoxide detectors- These detectors need to be installed on the appropriate level of your home. Replace batteries at least once a year. 7. Skin cancer- When out in the sun please cover up and use sunscreen 15 SPF or higher. 8. Violence- If anyone is threatening or hurting you, please tell your healthcare provider.  9. Drink alcohol in moderation- Limit alcohol intake to one drink or less per day. Never drink and drive.  Please be sure medication list is accurate. If a new problem present, please set up appointment sooner than planned today.

## 2019-11-19 NOTE — Assessment & Plan Note (Signed)
Continue Levothyroxine 175 mcg daily. I will give further recommendations according to TSH results.

## 2019-11-21 ENCOUNTER — Encounter: Payer: Self-pay | Admitting: Family Medicine

## 2019-12-07 ENCOUNTER — Other Ambulatory Visit: Payer: Self-pay | Admitting: Family Medicine

## 2019-12-07 DIAGNOSIS — E559 Vitamin D deficiency, unspecified: Secondary | ICD-10-CM

## 2020-01-27 ENCOUNTER — Other Ambulatory Visit: Payer: Self-pay | Admitting: Family Medicine

## 2020-04-20 ENCOUNTER — Other Ambulatory Visit: Payer: Self-pay | Admitting: Family Medicine

## 2020-05-30 ENCOUNTER — Encounter: Payer: Self-pay | Admitting: Family Medicine

## 2020-05-30 ENCOUNTER — Other Ambulatory Visit: Payer: Self-pay

## 2020-05-30 ENCOUNTER — Ambulatory Visit (INDEPENDENT_AMBULATORY_CARE_PROVIDER_SITE_OTHER): Payer: 59 | Admitting: Family Medicine

## 2020-05-30 VITALS — BP 110/70 | HR 97 | Resp 12 | Ht 71.0 in | Wt 241.0 lb

## 2020-05-30 DIAGNOSIS — R5383 Other fatigue: Secondary | ICD-10-CM

## 2020-05-30 DIAGNOSIS — Z1159 Encounter for screening for other viral diseases: Secondary | ICD-10-CM | POA: Diagnosis not present

## 2020-05-30 DIAGNOSIS — E039 Hypothyroidism, unspecified: Secondary | ICD-10-CM

## 2020-05-30 DIAGNOSIS — M79673 Pain in unspecified foot: Secondary | ICD-10-CM | POA: Diagnosis not present

## 2020-05-30 DIAGNOSIS — E559 Vitamin D deficiency, unspecified: Secondary | ICD-10-CM

## 2020-05-30 NOTE — Patient Instructions (Signed)
A few things to remember from today's visit:   Encounter for HCV screening test for low risk patient - Plan: Hepatitis C antibody  Pain of foot, unspecified laterality  Fatigue, unspecified type - Plan: COMPLETE METABOLIC PANEL WITH GFR, TSH, CBC, Testosterone  Hypothyroidism, unspecified type - Plan: TSH  Vitamin D deficiency, unspecified - Plan: COMPLETE METABOLIC PANEL WITH GFR, VITAMIN D 25 Hydroxy (Vit-D Deficiency, Fractures)  If labs are normal a sleep study needs to be considered.  If you need refills please call your pharmacy. Do not use My Chart to request refills or for acute issues that need immediate attention.    Please be sure medication list is accurate. If a new problem present, please set up appointment sooner than planned today.  Fatigue If you have fatigue, you feel tired all the time and have a lack of energy or a lack of motivation. Fatigue may make it difficult to start or complete tasks because of exhaustion. In general, occasional or mild fatigue is often a normal response to activity or life. However, long-lasting (chronic) or extreme fatigue may be a symptom of a medical condition. Follow these instructions at home: General instructions  Watch your fatigue for any changes.  Go to bed and get up at the same time every day.  Avoid fatigue by pacing yourself during the day and getting enough sleep at night.  Maintain a healthy weight. Medicines  Take over-the-counter and prescription medicines only as told by your health care provider.  Take a multivitamin, if told by your health care provider.  Do not use herbal or dietary supplements unless they are approved by your health care provider. Activity   Exercise regularly, as told by your health care provider.  Use or practice techniques to help you relax, such as yoga, tai chi, meditation, or massage therapy. Eating and drinking   Avoid heavy meals in the evening.  Eat a well-balanced diet,  which includes lean proteins, whole grains, plenty of fruits and vegetables, and low-fat dairy products.  Avoid consuming too much caffeine.  Avoid the use of alcohol.  Drink enough fluid to keep your urine pale yellow. Lifestyle  Change situations that cause you stress. Try to keep your work and personal schedule in balance.  Do not use any products that contain nicotine or tobacco, such as cigarettes and e-cigarettes. If you need help quitting, ask your health care provider.  Do not use drugs. Contact a health care provider if:  Your fatigue does not get better.  You have a fever.  You suddenly lose or gain weight.  You have headaches.  You have trouble falling asleep or sleeping through the night.  You feel angry, guilty, anxious, or sad.  You are unable to have a bowel movement (constipation).  Your skin is dry.  You have swelling in your legs or another part of your body. Get help right away if:  You feel confused.  Your vision is blurry.  You feel faint or you pass out.  You have a severe headache.  You have severe pain in your abdomen, your back, or the area between your waist and hips (pelvis).  You have chest pain, shortness of breath, or an irregular or fast heartbeat.  You are unable to urinate, or you urinate less than normal.  You have abnormal bleeding, such as bleeding from the rectum, vagina, nose, lungs, or nipples.  You vomit blood.  You have thoughts about hurting yourself or others. If you ever feel  like you may hurt yourself or others, or have thoughts about taking your own life, get help right away. You can go to your nearest emergency department or call:  Your local emergency services (911 in the [place]  A suicide crisis helpline, such as the [place] at 669 628 4674. This is open 24 hours a day. Summary  If you have fatigue, you feel tired all the time and have a lack of energy or a lack of  motivation.  Fatigue may make it difficult to start or complete tasks because of exhaustion.  Long-lasting (chronic) or extreme fatigue may be a symptom of a medical condition.  Exercise regularly, as told by your health care provider.  Change situations that cause you stress. Try to keep your work and personal schedule in balance. This information is not intended to replace advice given to you by your health care provider. Make sure you discuss any questions you have with your health care provider. Document Revised: 03/10/2019 Document Reviewed: 05/14/2017 Elsevier Patient Education  2020 [employer].

## 2020-05-30 NOTE — Progress Notes (Signed)
Chief Complaint  Patient presents with  . low energy  . pain in feet  . brain fog    sluggins,not as sharp with thinking   HPI: Daniel Juarez is a 49 y.o. male, who is here today with above concerns. Problems started in 04/2020,fatigue.  Initially worse through the day with activities around the house and now when he gets up he does not feel rested. Sleeps about 7-8 hours most of the time. Fatigue is worse at the end of the day. He feels "slow to think." No hx of recent illness or COVID 19 infection.  Negative for louder snoring or known apnea.  Feeling anxious, "mood swing" and getting annoyed easily for the past few months, since 03/2020. Negative for fever,night sweats,or abnormal wt loss.  Depression screen [facility] 2/9 06/01/2020 11/19/2019 11/06/2018 10/13/2017  Decreased Interest 1 0 0 0  Down, Depressed, Hopeless 0 0 0 0  PHQ - 2 Score 1 0 0 0  Altered sleeping 1 - - -  Tired, decreased energy 3 - - -  Change in appetite 1 - - -  Feeling bad or failure about yourself  0 - - -  Trouble concentrating 2 - - -  Moving slowly or fidgety/restless 0 - - -  Suicidal thoughts 0 - - -  PHQ-9 Score 8 - - -  Difficult doing work/chores Somewhat difficult - - -   GAD 7 : Generalized Anxiety Score 06/01/2020  Nervous, Anxious, on Edge 1  Control/stop worrying 0  Worry too much - different things 1  Trouble relaxing 1  Restless 0  Easily annoyed or irritable 1  Afraid - awful might happen 0  Total GAD 7 Score 4  Anxiety Difficulty Not difficult at all   Vit D deficiency: He is taking OTC gummies with vit D 4000 U and q 3 weeks Ergocalciferol 50,000 U.  Occasional cough, attributed to allergies, post nasal drainage. No associated wheezing,CP,or SOB.  Right foot sharp pain x 3 weeks, on top. Problem started after walking in uneven grass,a hole on his yard.He noted some edema,did not seek medical attention. He has a boot from previous fractures,wore it for 2-3 weeks, and  improved. Milder pain on top of left foot.  He is also c/o some pain/discomfort of teeth.  Hx of MT fracture x 2 without significant trauma. DEXA on 11/12/17:Normal.  Hypothyroidism: Follows with endocrinologist. He had some fatigue in 11/2019 and his endocrinologist recommended "more rest." Feeling "jittery" at times, not sure if this is caused by anxiety.  He is on Levothyroxine 175 mcg daily.  Lab Results  Component Value Date   TSH 0.72 11/19/2019   Negative for ED or decreased libido, states that he is so tired that he is not interested in sex.  Review of Systems  Constitutional: Positive for appetite change (Increased.). Negative for activity change.  HENT: Negative for mouth sores, nosebleeds and sore throat.   Eyes: Negative for redness and visual disturbance.  Cardiovascular: Negative for palpitations and leg swelling.  Gastrointestinal: Negative for abdominal pain, nausea and vomiting.  Endocrine: Negative for cold intolerance and heat intolerance.  Genitourinary: Negative for decreased urine volume, dysuria and hematuria.  Allergic/Immunologic: Positive for environmental allergies.  Neurological: Negative for syncope, facial asymmetry and headaches.  Hematological: Negative for adenopathy. Does not bruise/bleed easily.  Psychiatric/Behavioral: Negative for confusion and sleep disturbance. The patient is nervous/anxious.   Rest see pertinent positives and negatives per HPI.  Current Outpatient Medications on  File Prior to Visit  Medication Sig Dispense Refill  . Cholecalciferol (VITAMIN D3 PO) Take 5,000 Units by mouth daily.    . fluticasone (FLONASE) 50 MCG/ACT nasal spray Place 1 spray into both nostrils 2 (two) times daily. (Patient taking differently: Place 1 spray into both nostrils as needed. ) 16 g 1  . Multiple Vitamin (MULTIVITAMIN PO) Take by mouth daily.    . Multiple Vitamins-Minerals (ZINC PO) Take by mouth daily.    Juarez Kitchen SYNTHROID 175 MCG tablet TAKE 1  TABLET BY MOUTH EVERY DAY 90 tablet 0  . Vitamin D, Ergocalciferol, (DRISDOL) 1.25 MG (50000 UNIT) CAPS capsule TAKE 1 CAPSULE BY MOUTH EVERY 3 WEEKS 4 capsule 3   No current facility-administered medications on file prior to visit.   Past Medical History:  Diagnosis Date  . Allergy   . Arthritis   . Chicken pox   . Hyperbilirubinemia   . Rosacea, unspecified    Follows with dermatologists.  . Thyroid disease    hypo   No Known Allergies  Social History   Socioeconomic History  . Marital status: Married    Spouse name: Not on file  . Number of children: Not on file  . Years of education: Not on file  . Highest education level: Not on file  Occupational History  . Not on file  Tobacco Use  . Smoking status: Never Smoker  . Smokeless tobacco: Never Used  Vaping Use  . Vaping Use: Never used  Substance and Sexual Activity  . Alcohol use: Yes    Comment: occasionally  . Drug use: No  . Sexual activity: Yes    Partners: Female  Other Topics Concern  . Not on file  Social History Narrative  . Not on file   Social Determinants of Health   Financial Resource Strain:   . Difficulty of Paying Living Expenses: Not on file  Food Insecurity:   . Worried About Charity fundraiser in the Last Year: Not on file  . Ran Out of Food in the Last Year: Not on file  Transportation Needs:   . Lack of Transportation (Medical): Not on file  . Lack of Transportation (Non-Medical): Not on file  Physical Activity:   . Days of Exercise per Week: Not on file  . Minutes of Exercise per Session: Not on file  Stress:   . Feeling of Stress : Not on file  Social Connections:   . Frequency of Communication with Friends and Family: Not on file  . Frequency of Social Gatherings with Friends and Family: Not on file  . Attends Religious Services: Not on file  . Active Member of Clubs or Organizations: Not on file  . Attends Archivist Meetings: Not on file  . Marital Status: Not on  file    Vitals:   05/30/20 0954  BP: 110/70  Pulse: 97  Resp: 12  SpO2: 96%   Body mass index is 33.61 kg/m.  Physical Exam Vitals and nursing note reviewed.  Constitutional:      General: He is not in acute distress.    Appearance: He is well-developed.  HENT:     Head: Normocephalic and atraumatic.     Mouth/Throat:     Mouth: Mucous membranes are moist. No oral lesions.     Tongue: No lesions.     Palate: No lesions.     Pharynx: Oropharynx is clear.  Eyes:     Conjunctiva/sclera: Conjunctivae normal.  Pupils: Pupils are equal, round, and reactive to light.  Cardiovascular:     Rate and Rhythm: Normal rate and regular rhythm.     Pulses:          Dorsalis pedis pulses are 2+ on the right side and 2+ on the left side.     Heart sounds: No murmur heard.   Pulmonary:     Effort: Pulmonary effort is normal. No respiratory distress.     Breath sounds: Normal breath sounds.  Abdominal:     Palpations: Abdomen is soft. There is no hepatomegaly or mass.     Tenderness: There is no abdominal tenderness.  Musculoskeletal:     Right foot: Normal range of motion. No deformity.     Left foot: Normal range of motion. No deformity.  Feet:     Right foot:     Skin integrity: No dry skin.     Toenail Condition: Right toenails are normal.     Left foot:     Skin integrity: No dry skin.     Toenail Condition: Left toenails are normal.     Comments: No tenderness elicited with palpation. Lymphadenopathy:     Cervical: No cervical adenopathy.  Skin:    General: Skin is warm.     Findings: No erythema or rash.  Neurological:     General: No focal deficit present.     Mental Status: He is alert and oriented to person, place, and time.     Cranial Nerves: No cranial nerve deficit.     Gait: Gait normal.  Psychiatric:        Mood and Affect: Affect normal. Mood is anxious.     Comments: Well groomed, good eye contact.     ASSESSMENT AND PLAN:  Copelan was seen today  for low energy, pain in feet and brain fog.  Diagnoses and all orders for this visit: Orders Placed This Encounter  Procedures  . COMPLETE METABOLIC PANEL WITH GFR  . TSH  . CBC  . VITAMIN D 25 Hydroxy (Vit-D Deficiency, Fractures)  . Hepatitis C antibody  . Testosterone   Lab Results  Component Value Date   TSH 0.53 05/30/2020   Lab Results  Component Value Date   TESTOSTERONE 432 05/30/2020   Lab Results  Component Value Date   CREATININE 0.83 05/30/2020   BUN 12 05/30/2020   NA 142 05/30/2020   K 4.6 05/30/2020   CL 103 05/30/2020   CO2 30 05/30/2020   Lab Results  Component Value Date   ALT 20 05/30/2020   AST 19 05/30/2020   ALKPHOS 61 11/19/2019   BILITOT 1.8 (H) 05/30/2020   Lab Results  Component Value Date   WBC 6.3 05/30/2020   HGB 15.1 05/30/2020   HCT 45.2 05/30/2020   MCV 93.0 05/30/2020   PLT 227 05/30/2020    Pain of foot, unspecified laterality Pain is not elicited with examination today. I do not think imaging is needed today. Recommend podiatrist evaluation if persistent.  Fatigue, unspecified type We discussed possible etiologies: Systemic illness, immunologic,endocrinology,sleep disorder, psychiatric/psychologic, infectious,medications side effects, and idiopathic. Examination today does not suggest a serious process.  Further recommendations will be given according to lab results.  Hypothyroidism, unspecified type Concerned about lower normal range TSH last visit. Continue Levothyroxine 175 mcg, med will be adjusted if needed and according to TSH.  Vitamin D deficiency, unspecified He is taking more vit D that recommended, Ergocalciferol 50,000 U q and 4000 U. Dose  will be adjusted according to 25 [place] vit D results.  Encounter for HCV screening test for low risk patient -     Hepatitis C antibody   Return if symptoms worsen or fail to improve, for Depending on lab results.   Chade Pitner G. [place], MD  [facility]. [place] office.    A few things to remember from today's visit:   Encounter for HCV screening test for low risk patient - Plan: Hepatitis C antibody  Pain of foot, unspecified laterality  Fatigue, unspecified type - Plan: COMPLETE METABOLIC PANEL WITH GFR, TSH, CBC, Testosterone  Hypothyroidism, unspecified type - Plan: TSH  Vitamin D deficiency, unspecified - Plan: COMPLETE METABOLIC PANEL WITH GFR, VITAMIN D 25 Hydroxy (Vit-D Deficiency, Fractures)  If labs are normal a sleep study needs to be considered.  If you need refills please call your pharmacy. Do not use My Chart to request refills or for acute issues that need immediate attention.    Please be sure medication list is accurate. If a new problem present, please set up appointment sooner than planned today.  Fatigue If you have fatigue, you feel tired all the time and have a lack of energy or a lack of motivation. Fatigue may make it difficult to start or complete tasks because of exhaustion. In general, occasional or mild fatigue is often a normal response to activity or life. However, long-lasting (chronic) or extreme fatigue may be a symptom of a medical condition. Follow these instructions at home: General instructions  Watch your fatigue for any changes.  Go to bed and get up at the same time every day.  Avoid fatigue by pacing yourself during the day and getting enough sleep at night.  Maintain a healthy weight. Medicines  Take over-the-counter and prescription medicines only as told by your health care provider.  Take a multivitamin, if told by your health care provider.  Do not use herbal or dietary supplements unless they are approved by your health care provider. Activity   Exercise regularly, as told by your health care provider.  Use or practice techniques to help you relax, such as yoga, tai chi, meditation, or massage therapy. Eating and drinking   Avoid heavy meals in the  evening.  Eat a well-balanced diet, which includes lean proteins, whole grains, plenty of fruits and vegetables, and low-fat dairy products.  Avoid consuming too much caffeine.  Avoid the use of alcohol.  Drink enough fluid to keep your urine pale yellow. Lifestyle  Change situations that cause you stress. Try to keep your work and personal schedule in balance.  Do not use any products that contain nicotine or tobacco, such as cigarettes and e-cigarettes. If you need help quitting, ask your health care provider.  Do not use drugs. Contact a health care provider if:  Your fatigue does not get better.  You have a fever.  You suddenly lose or gain weight.  You have headaches.  You have trouble falling asleep or sleeping through the night.  You feel angry, guilty, anxious, or sad.  You are unable to have a bowel movement (constipation).  Your skin is dry.  You have swelling in your legs or another part of your body. Get help right away if:  You feel confused.  Your vision is blurry.  You feel faint or you pass out.  You have a severe headache.  You have severe pain in your abdomen, your back, or the area between your waist and hips (pelvis).  You have chest pain, shortness of breath, or an irregular or fast heartbeat.  You are unable to urinate, or you urinate less than normal.  You have abnormal bleeding, such as bleeding from the rectum, vagina, nose, lungs, or nipples.  You vomit blood.  You have thoughts about hurting yourself or others. If you ever feel like you may hurt yourself or others, or have thoughts about taking your own life, get help right away. You can go to your nearest emergency department or call:  Your local emergency services (911 in the [place]  A suicide crisis helpline, such as the [place] at 825-714-9155. This is open 24 hours a day. Summary  If you have fatigue, you feel tired all the time and have a  lack of energy or a lack of motivation.  Fatigue may make it difficult to start or complete tasks because of exhaustion.  Long-lasting (chronic) or extreme fatigue may be a symptom of a medical condition.  Exercise regularly, as told by your health care provider.  Change situations that cause you stress. Try to keep your work and personal schedule in balance. This information is not intended to replace advice given to you by your health care provider. Make sure you discuss any questions you have with your health care provider. Document Revised: 03/10/2019 Document Reviewed: 05/14/2017 Elsevier Patient Education  2020 [employer].

## 2020-05-31 LAB — CBC
HCT: 45.2 % (ref 38.5–50.0)
Hemoglobin: 15.1 g/dL (ref 13.2–17.1)
MCH: 31.1 pg (ref 27.0–33.0)
MCHC: 33.4 g/dL (ref 32.0–36.0)
MCV: 93 fL (ref 80.0–100.0)
MPV: 11.1 fL (ref 7.5–12.5)
Platelets: 227 10*3/uL (ref 140–400)
RBC: 4.86 10*6/uL (ref 4.20–5.80)
RDW: 12.3 % (ref 11.0–15.0)
WBC: 6.3 10*3/uL (ref 3.8–10.8)

## 2020-05-31 LAB — COMPLETE METABOLIC PANEL WITH GFR
AG Ratio: 1.7 (calc) (ref 1.0–2.5)
ALT: 20 U/L (ref 9–46)
AST: 19 U/L (ref 10–35)
Albumin: 4.4 g/dL (ref 3.6–5.1)
Alkaline phosphatase (APISO): 54 U/L (ref 35–144)
BUN: 12 mg/dL (ref 7–25)
CO2: 30 mmol/L (ref 20–32)
Calcium: 9.3 mg/dL (ref 8.6–10.3)
Chloride: 103 mmol/L (ref 98–110)
Creat: 0.83 mg/dL (ref 0.70–1.33)
GFR, Est African American: 118 mL/min/{1.73_m2} (ref >=60)
GFR, Est Non African American: 102 mL/min/{1.73_m2} (ref >=60)
Globulin: 2.6 g/dL (calc) (ref 1.9–3.7)
Glucose, Bld: 88 mg/dL (ref 65–99)
Potassium: 4.6 mmol/L (ref 3.5–5.3)
Sodium: 142 mmol/L (ref 135–146)
Total Bilirubin: 1.8 mg/dL — ABNORMAL HIGH (ref 0.2–1.2)
Total Protein: 7 g/dL (ref 6.1–8.1)

## 2020-05-31 LAB — TSH: TSH: 0.53 mIU/L (ref 0.40–4.50)

## 2020-05-31 LAB — TESTOSTERONE: Testosterone: 432 ng/dL (ref 250–827)

## 2020-05-31 LAB — VITAMIN D 25 HYDROXY (VIT D DEFICIENCY, FRACTURES): Vit D, 25-Hydroxy: 70 ng/mL (ref 30–100)

## 2020-05-31 LAB — HEPATITIS C ANTIBODY
Hepatitis C Ab: NONREACTIVE
SIGNAL TO CUT-OFF: 0.01 (ref <1.00)

## 2020-06-01 ENCOUNTER — Encounter: Payer: Self-pay | Admitting: Family Medicine

## 2020-06-02 ENCOUNTER — Encounter: Payer: Self-pay | Admitting: Family Medicine

## 2020-06-02 DIAGNOSIS — E039 Hypothyroidism, unspecified: Secondary | ICD-10-CM

## 2020-06-26 ENCOUNTER — Encounter: Payer: Self-pay | Admitting: Internal Medicine

## 2020-07-12 ENCOUNTER — Other Ambulatory Visit: Payer: Self-pay | Admitting: Family Medicine

## 2020-08-07 ENCOUNTER — Telehealth: Payer: Self-pay | Admitting: Internal Medicine

## 2020-08-07 NOTE — Telephone Encounter (Signed)
Recv'd records from [facility] forwarded 17 pages to Dr. Rosalva Ferron Endocrinology 12/6/21fbg

## 2020-08-08 NOTE — Progress Notes (Signed)
Name: Daniel Juarez  MRN/ DOB: [id number], 1971-05-10    Age/ Sex: 49 y.o., male    PCP: [place], Betty G, MD   Reason for Endocrinology Evaluation: Hypothyroidism      Date of Initial Endocrinology Evaluation: 08/09/2020     HPI: [name] is a 49 y.o. male with a past medical history of hypothyroidism and allergies as well as vitamin D deficiency. The patient presented for initial endocrinology clinic visit on 08/09/2020 for consultative assistance with his Hypothyroidism .   He has been diagnosed with hypothyroidism in 2008. Baseline thyroid levels not available. He has been on LT-4 replacement since then.    In 2012 he was diagnosed with Vitamin B12 and D deficiency  In  Review of his records he was seen by Dr. Elyse Hsu in 2018, per notes he was evaluated in 2014 for cognitive symptoms and fatigue . There was some suspicion of depression and multiple situation stresses at the time,  as all his labs were normal at the time.     Today he continues to complain of memory issues, anxiety, confusion,  alternating bowel movement between constipation but more on the loose side, as well as insomnia.    He feels that his TSH being on the low end is the reason for his above symptoms     Pt also would like to discuss his hx of stress foot fracture in the past. These records are not available. He has jaw and teeth pains but after readings some articles he is concerned there's an issues with the bones. Of note he had a DXA scan in 2019 which came back normal  He recently stepped in a hole and his foot starting hurting just like when he was diagnosed with stress fracture, work boot for ~ 3 weeks.  Pt sees a dentist twice yearly with no concerns from dentist stand point Denies tobacco use or illicit drug use    Levothyroxine 175 mcg , half on Thursday and 1 tablet the res of the week    HISTORY:  Past Medical History:  Past Medical History:  Diagnosis Date  . Allergy    . Arthritis   . Chicken pox   . Hyperbilirubinemia   . Rosacea, unspecified    Follows with dermatologists.  . Thyroid disease    hypo   Past Surgical History:  Past Surgical History:  Procedure Laterality Date  . ANTERIOR CERVICAL DECOMP/DISCECTOMY FUSION    . TONSILLECTOMY    . VASECTOMY        Social History:  reports that he has never smoked. He has never used smokeless tobacco. He reports current alcohol use. He reports that he does not use drugs.  Family History: family history includes Atrial fibrillation in his brother and mother; Cerebral palsy in his sister; Diabetes in his mother; Hypertension in his mother; Multiple sclerosis in his father.   HOME MEDICATIONS: Allergies as of 08/09/2020   No Known Allergies     Medication List       Accurate as of August 09, 2020  9:42 AM. If you have any questions, ask your nurse or doctor.        fluticasone 50 MCG/ACT nasal spray Commonly known as: Flonase Place 1 spray into both nostrils 2 (two) times daily.   MULTIVITAMIN PO Take by mouth daily.   Synthroid 175 MCG tablet Generic drug: levothyroxine TAKE 1 TABLET BY MOUTH EVERY DAY   Vitamin D (Ergocalciferol) 1.25 MG (50000  UNIT) Caps capsule Commonly known as: DRISDOL TAKE 1 CAPSULE BY MOUTH EVERY 3 WEEKS   VITAMIN D3 PO Take 5,000 Units by mouth daily.   ZINC PO Take by mouth daily.         REVIEW OF SYSTEMS: A comprehensive ROS was conducted with the patient and is negative except as per HPI    OBJECTIVE:  VS: BP 130/82   Pulse 70   Ht $R'5\' 11"'FL$  (1.803 m)   Wt 238 lb 8 oz (108.2 kg)   SpO2 98%   BMI 33.26 kg/m    Wt Readings from Last 3 Encounters:  08/09/20 238 lb 8 oz (108.2 kg)  05/30/20 241 lb (109.3 kg)  11/19/19 232 lb (105.2 kg)     EXAM: General: Pt appears well and is in NAD  Mouth : Teeth are intact   Neck: General: Supple without adenopathy. Thyroid: Thyroid size normal.  No goiter or nodules appreciated.   Lungs: Clear    Heart: Auscultation: RRR.  Abdomen: Normoactive bowel sounds, soft, nontender, without masses or organomegaly palpable  Extremities:  BL LE: No pretibial edema normal ROM and strength.  Mental Status: Judgment, insight: Intact Memory: Intact for recent and remote events Mood and affect: No depression, anxiety, or agitation     DATA REVIEWED: Results for JQUAN, EGELSTON (MRN [id number]) as of 08/09/2020 13:32  Ref. Range 08/09/2020 10:09  Sodium Latest Ref Range: 135 - 145 mEq/L 140  Potassium Latest Ref Range: 3.5 - 5.1 mEq/L 4.1  Chloride Latest Ref Range: 96 - 112 mEq/L 102  CO2 Latest Ref Range: 19 - 32 mEq/L 30  Glucose Latest Ref Range: 70 - 99 mg/dL 85  BUN Latest Ref Range: 6 - 23 mg/dL 13  Creatinine Latest Ref Range: 0.40 - 1.50 mg/dL 0.88  Calcium Latest Ref Range: 8.4 - 10.5 mg/dL 9.4  Phosphorus Latest Ref Range: 2.3 - 4.6 mg/dL 3.5  Magnesium Latest Ref Range: 1.5 - 2.5 mg/dL 2.1  Alkaline Phosphatase Latest Ref Range: 39 - 117 U/L 59  Albumin Latest Ref Range: 3.5 - 5.2 g/dL 4.6  AST Latest Ref Range: 0 - 37 U/L 23  ALT Latest Ref Range: 0 - 53 U/L 23  Total Protein Latest Ref Range: 6.0 - 8.3 g/dL 7.8  Total Bilirubin Latest Ref Range: 0.2 - 1.2 mg/dL 1.4 (H)  GFR Latest Ref Range: >60.00 mL/min 99.67  TSH Latest Ref Range: 0.35 - 4.50 uIU/mL 0.86      ASSESSMENT/PLAN/RECOMMENDATIONS:   1. Hypothyroidism:  - Pt with non-specific symptoms  - His PCP reduced his dose back in September, repeat TSh today still at the low end of normal, will adjust dose as below     Medications : Stop Levothyroxine 175 mcg  Start Levothyroxine 150 mcg daily   2. Hx of stress Fracture:   - His Alkaline phosphatase , TSH and calcium level as well as vitamin D have been normal - DXA is normal  - Mg and phosphorus are normal as well as Alk phos. And Vitamin D    F/U in 3 months   Signed electronically by: Mack Guise, MD  [facility]   [address] [facility] [address] [place], [place] [address] Phone: [phone] FAX: [phone]   CC: [place], Betty G, [place] [address] Phone: [phone] Fax: [phone]   Return to Endocrinology clinic as below: No future appointments.

## 2020-08-09 ENCOUNTER — Other Ambulatory Visit: Payer: Self-pay

## 2020-08-09 ENCOUNTER — Ambulatory Visit (INDEPENDENT_AMBULATORY_CARE_PROVIDER_SITE_OTHER): Payer: BC Managed Care – PPO | Admitting: Internal Medicine

## 2020-08-09 ENCOUNTER — Encounter: Payer: Self-pay | Admitting: Internal Medicine

## 2020-08-09 VITALS — BP 130/82 | HR 70 | Ht 71.0 in | Wt 238.5 lb

## 2020-08-09 DIAGNOSIS — E039 Hypothyroidism, unspecified: Secondary | ICD-10-CM | POA: Diagnosis not present

## 2020-08-09 DIAGNOSIS — Z87312 Personal history of (healed) stress fracture: Secondary | ICD-10-CM | POA: Diagnosis not present

## 2020-08-09 LAB — COMPREHENSIVE METABOLIC PANEL
ALT: 23 U/L (ref 0–53)
AST: 23 U/L (ref 0–37)
Albumin: 4.6 g/dL (ref 3.5–5.2)
Alkaline Phosphatase: 59 U/L (ref 39–117)
BUN: 13 mg/dL (ref 6–23)
CO2: 30 mEq/L (ref 19–32)
Calcium: 9.4 mg/dL (ref 8.4–10.5)
Chloride: 102 mEq/L (ref 96–112)
Creatinine, Ser: 0.88 mg/dL (ref 0.40–1.50)
GFR: 99.67 mL/min (ref >60.00)
Glucose, Bld: 85 mg/dL (ref 70–99)
Potassium: 4.1 mEq/L (ref 3.5–5.1)
Sodium: 140 mEq/L (ref 135–145)
Total Bilirubin: 1.4 mg/dL — ABNORMAL HIGH (ref 0.2–1.2)
Total Protein: 7.8 g/dL (ref 6.0–8.3)

## 2020-08-09 LAB — MAGNESIUM: Magnesium: 2.1 mg/dL (ref 1.5–2.5)

## 2020-08-09 LAB — PHOSPHORUS: Phosphorus: 3.5 mg/dL (ref 2.3–4.6)

## 2020-08-09 LAB — TSH: TSH: 0.86 u[IU]/mL (ref 0.35–4.50)

## 2020-08-09 MED ORDER — LEVOTHYROXINE SODIUM 150 MCG PO TABS: 150.0000 ug | ORAL_TABLET | Freq: Every day | ORAL | 3 refills | Status: DC

## 2020-08-09 NOTE — Patient Instructions (Signed)

## 2020-11-08 ENCOUNTER — Ambulatory Visit (INDEPENDENT_AMBULATORY_CARE_PROVIDER_SITE_OTHER): Payer: BC Managed Care – PPO | Admitting: Internal Medicine

## 2020-11-08 ENCOUNTER — Other Ambulatory Visit: Payer: Self-pay

## 2020-11-08 ENCOUNTER — Encounter: Payer: Self-pay | Admitting: Internal Medicine

## 2020-11-08 VITALS — BP 128/82 | HR 60 | Ht 71.0 in | Wt 247.0 lb

## 2020-11-08 DIAGNOSIS — E039 Hypothyroidism, unspecified: Secondary | ICD-10-CM | POA: Insufficient documentation

## 2020-11-08 DIAGNOSIS — Z87312 Personal history of (healed) stress fracture: Secondary | ICD-10-CM

## 2020-11-08 LAB — TSH: TSH: 1.47 u[IU]/mL (ref 0.35–4.50)

## 2020-11-08 LAB — VITAMIN D 25 HYDROXY (VIT D DEFICIENCY, FRACTURES): VITD: 47.68 ng/mL (ref 30.00–100.00)

## 2020-11-08 NOTE — Progress Notes (Signed)
Name: Daniel Juarez  MRN/ DOB: [id number], 18-Dec-1970    Age/ Sex: 50 y.o., male     PCP: [place], Betty G, MD   Reason for Endocrinology Evaluation: Hypothyroidism     Initial Endocrinology Clinic Visit: 08/09/2020    PATIENT IDENTIFIER: Daniel Juarez is a 51 y.o., male with a past medical history of hypothyroidism, environmental allergies and Vitamin D deficiency. He has followed with [facility] since 08/09/2020 for consultative assistance with management of his hypothyroidism.   HISTORICAL SUMMARY:   He has been diagnosed with hypothyroidism in 2008. Baseline thyroid levels not available. He has been on LT-4 replacement since then.    In 2012 he was diagnosed with Vitamin B12 and D deficiency  In  Review of his records he was seen by Dr. Elyse Hsu in 2018, per notes he was evaluated in 2014 for cognitive symptoms and fatigue . There was some suspicion of depression and multiple situation stresses at the time,  as all his labs were normal at the time.     He was having issues with anxiety, confusion, loose stools that he attributed to low normal TSH level, we reduced his levothyroxine from 175 to 150 mcg daily   He had concerns about feet stress fractures, but his Alk. Phos, Calcium, vitamin D , PTH , Mg and phos as well as DXA scan were all normal.     SUBJECTIVE:     Today (11/08/2020):  Mr. [name] is here for a follow up on hypothyroidism  Pt has been noted with weight gain  Denies constipation  Has noted less anxiety, confusion and fogginess  Feels more energetic  Denies local neck symptoms    Left foot pain resolved   Levothyroxine 150 mcg daily  Takes Vitamin D 5000 EVERY OTHER DAY.     HISTORY:  Past Medical History:  Past Medical History:  Diagnosis Date  . Allergy   . Arthritis   . Chicken pox   . Hyperbilirubinemia   . Rosacea, unspecified    Follows with dermatologists.  . Thyroid disease    hypo   Past  Surgical History:  Past Surgical History:  Procedure Laterality Date  . ANTERIOR CERVICAL DECOMP/DISCECTOMY FUSION    . TONSILLECTOMY    . VASECTOMY      Social History:  reports that he has never smoked. He has never used smokeless tobacco. He reports current alcohol use. He reports that he does not use drugs. Family History:  Family History  Problem Relation Age of Onset  . Diabetes Mother   . Hypertension Mother   . Atrial fibrillation Mother   . Multiple sclerosis Father   . Cerebral palsy Sister   . Atrial fibrillation Brother   . Colon cancer Neg Hx   . Esophageal cancer Neg Hx   . Rectal cancer Neg Hx   . Stomach cancer Neg Hx      HOME MEDICATIONS: Allergies as of 11/08/2020   No Known Allergies     Medication List       Accurate as of November 08, 2020  2:47 PM. If you have any questions, ask your nurse or doctor.        fluticasone 50 MCG/ACT nasal spray Commonly known as: Flonase Place 1 spray into both nostrils 2 (two) times daily.   levothyroxine 150 MCG tablet Commonly known as: SYNTHROID Take 1 tablet (150 mcg total) by mouth daily.   MULTIVITAMIN PO Take by mouth daily.   Vitamin  D (Ergocalciferol) 1.25 MG (50000 UNIT) Caps capsule Commonly known as: DRISDOL TAKE 1 CAPSULE BY MOUTH EVERY 3 WEEKS   VITAMIN D3 PO Take 5,000 Units by mouth daily.   ZINC PO Take by mouth daily.         OBJECTIVE:   PHYSICAL EXAM: VS: BP 128/82   Pulse 60   Ht $R'5\' 11"'xS$  (1.803 m)   Wt 247 lb (112 kg)   SpO2 98%   BMI 34.45 kg/m    EXAM: General: Pt appears well and is in NAD  Neck: General: Supple without adenopathy. Thyroid: Thyroid size normal.  No goiter or nodules appreciated.  Lungs: Clear with good BS bilat with no rales, rhonchi, or wheezes  Heart: Auscultation: RRR.  Abdomen: Normoactive bowel sounds, soft, nontender, without masses or organomegaly palpable  Extremities:  BL LE: No pretibial edema normal ROM and strength.  Mental Status:  Judgment, insight: Intact Orientation: Oriented to time, place, and person Mood and affect: No depression, anxiety, or agitation     DATA REVIEWED:  Results for ORLANDUS, BOROWSKI (MRN [id number]) as of 11/08/2020 15:53  Ref. Range 11/08/2020 09:59  VITD Latest Ref Range: 30.00 - 100.00 ng/mL 47.68  TSH Latest Ref Range: 0.35 - 4.50 uIU/mL 1.47     ASSESSMENT / PLAN / RECOMMENDATIONS:   1. Hypothyroidism:   - Clinically he is feeling better  - No local neck symptoms  - He has been taking Levothyroxine appropriately    Medications   Levothyroxine 150 mcg daily   2. Vitamin D Deficiency :  - He is on Vitamin D3 5000 iu every other day     F/U in 4 months    Signed electronically by: Mack Guise, MD  [facility]  [address] [facility] [address] [place], [address] [address] Phone: [phone] FAX: [phone]      CC: [place], Betty G, [place] [address] Phone: [phone]  Fax: [phone]   Return to Endocrinology clinic as below: Future Appointments  Date Time Provider [place]  03/21/2021  9:10 AM Shamleffer, Melanie Crazier, MD [facility] None

## 2020-11-08 NOTE — Patient Instructions (Signed)
-   Continue Levothyroxine 150 mcg daily for now  - Continue Vitamin D 5000 iu every other day

## 2020-11-17 ENCOUNTER — Other Ambulatory Visit: Payer: Self-pay | Admitting: Family Medicine

## 2020-11-17 DIAGNOSIS — E559 Vitamin D deficiency, unspecified: Secondary | ICD-10-CM

## 2021-02-09 ENCOUNTER — Ambulatory Visit (INDEPENDENT_AMBULATORY_CARE_PROVIDER_SITE_OTHER): Payer: BC Managed Care – PPO | Admitting: Family Medicine

## 2021-02-09 ENCOUNTER — Encounter: Payer: Self-pay | Admitting: Family Medicine

## 2021-02-09 ENCOUNTER — Other Ambulatory Visit: Payer: Self-pay

## 2021-02-09 VITALS — BP 128/82 | HR 68 | Temp 98.2°F | Wt 240.0 lb

## 2021-02-09 DIAGNOSIS — N451 Epididymitis: Secondary | ICD-10-CM | POA: Diagnosis not present

## 2021-02-09 MED ORDER — DOXYCYCLINE HYCLATE 100 MG PO CAPS: 100.0000 mg | ORAL_CAPSULE | Freq: Two times a day (BID) | ORAL | 0 refills | Status: AC

## 2021-02-09 NOTE — Progress Notes (Signed)
   Subjective:    Patient ID: Daniel Juarez, male    DOB: 1971-02-09, 50 y.o.   MRN: [id number]  HPI Here for the gradual onset 3 days ago of pain in the left groin area and in the left testicle. No fever or urinary symptoms. No recent trauma.    Review of Systems  Constitutional: Negative.   Respiratory: Negative.    Cardiovascular: Negative.   Genitourinary:  Positive for testicular pain. Negative for dysuria, flank pain, hematuria, penile discharge and urgency.      Objective:   Physical Exam Constitutional:      General: He is not in acute distress.    Appearance: Normal appearance.  Cardiovascular:     Rate and Rhythm: Normal rate and regular rhythm.     Pulses: Normal pulses.     Heart sounds: Normal heart sounds.  Pulmonary:     Effort: Pulmonary effort is normal.     Breath sounds: Normal breath sounds.  Abdominal:     General: Abdomen is flat. Bowel sounds are normal. There is no distension.     Palpations: Abdomen is soft. There is no mass.     Tenderness: There is no abdominal tenderness. There is no right CVA tenderness, left CVA tenderness, guarding or rebound.     Hernia: No hernia is present.  Genitourinary:    Penis: Normal.      Testes: Normal.     Comments: He is quite tender over the left epididymus  Neurological:     Mental Status: He is alert.          Assessment & Plan:  Epididymitis. Treat with 10 days of Doxycycline. Recheck as needed . Alysia Penna, MD

## 2021-02-22 DIAGNOSIS — L718 Other rosacea: Secondary | ICD-10-CM | POA: Diagnosis not present

## 2021-02-22 DIAGNOSIS — L820 Inflamed seborrheic keratosis: Secondary | ICD-10-CM | POA: Diagnosis not present

## 2021-03-20 NOTE — Progress Notes (Signed)
Name: Daniel Juarez  MRN/ DOB: [id number], Jul 30, 1971    Age/ Sex: 50 y.o., male     PCP: [place], Betty G, MD   Reason for Endocrinology Evaluation: Hypothyroidism     Initial Endocrinology Clinic Visit: 08/09/2020    PATIENT IDENTIFIER: [name] is a 50 y.o., male with a past medical history of hypothyroidism, environmental allergies and Vitamin D deficiency. He has followed with [facility] since 08/09/2020 for consultative assistance with management of his hypothyroidism.   HISTORICAL SUMMARY:   He has been diagnosed with hypothyroidism in 2008. Baseline thyroid levels not available. He has been on LT-4 replacement since then.      In 2012 he was diagnosed with Vitamin B12 and D deficiency   In  Review of his records he was seen by Dr. Elyse Hsu in 2018, per notes he was evaluated in 2014 for cognitive symptoms and fatigue . There was some suspicion of depression and multiple situation stresses at the time,  as all his labs were normal at the time.     He was having issues with anxiety, confusion, loose stools that he attributed to low normal TSH level, we reduced his levothyroxine from 175 to 150 mcg daily   He had concerns about feet stress fractures, but his Alk. Phos, Calcium, vitamin D , PTH , Mg and phos as well as DXA scan were all normal.     SUBJECTIVE:     Today (03/21/2021):  [name] is here for a follow up on hypothyroidism  Pt has been noted with weight loss  Denies constipation  Has noted less anxiety,stable  confusion and fogginess  Denies local neck symptoms     Levothyroxine 150 mcg daily  Takes Vitamin D 5000 EVERY OTHER DAY.     HISTORY:  Past Medical History:  Past Medical History:  Diagnosis Date   Allergy    Arthritis    Chicken pox    Hyperbilirubinemia    Rosacea, unspecified    Follows with dermatologists.   Thyroid disease    hypo   Past Surgical History:  Past Surgical History:  Procedure  Laterality Date   ANTERIOR CERVICAL DECOMP/DISCECTOMY FUSION     TONSILLECTOMY     VASECTOMY     Social History:  reports that he has never smoked. He has never used smokeless tobacco. He reports current alcohol use. He reports that he does not use drugs. Family History:  Family History  Problem Relation Age of Onset   Diabetes Mother    Hypertension Mother    Atrial fibrillation Mother    Multiple sclerosis Father    Cerebral palsy Sister    Atrial fibrillation Brother    Colon cancer Neg Hx    Esophageal cancer Neg Hx    Rectal cancer Neg Hx    Stomach cancer Neg Hx      HOME MEDICATIONS: Allergies as of 03/21/2021   No Known Allergies      Medication List        Accurate as of March 21, 2021  9:44 AM. If you have any questions, ask your nurse or doctor.          fluticasone 50 MCG/ACT nasal spray Commonly known as: Flonase Place 1 spray into both nostrils 2 (two) times daily.   levothyroxine 150 MCG tablet Commonly known as: SYNTHROID Take 1 tablet (150 mcg total) by mouth daily.   MULTIVITAMIN PO Take by mouth daily.   Vitamin D (Ergocalciferol)  1.25 MG (50000 UNIT) Caps capsule Commonly known as: DRISDOL TAKE 1 CAPSULE BY MOUTH EVERY 3 WEEKS   VITAMIN D3 PO Take 5,000 Units by mouth daily.   ZINC PO Take by mouth daily.          OBJECTIVE:   PHYSICAL EXAM: VS: BP 118/64   Pulse 68   Ht 6' (1.829 m)   Wt 239 lb (108.4 kg)   SpO2 98%   BMI 32.41 kg/m    EXAM: General: Pt appears well and is in NAD  Neck: General: Supple without adenopathy. Thyroid: Thyroid size normal.  No goiter or nodules appreciated.  Lungs: Clear with good BS bilat with no rales, rhonchi, or wheezes  Heart: Auscultation: RRR.  Abdomen: Normoactive bowel sounds, soft, nontender, without masses or organomegaly palpable  Extremities:  BL LE: No pretibial edema normal ROM and strength.  Mental Status: Judgment, insight: Intact Orientation: Oriented to time, place,  and person Mood and affect: No depression, anxiety, or agitation     DATA REVIEWED:  Results for [employer], [place] JOSEPH "JIM" (MRN [id number] for TAURUS, ALAMO (MRN [id number]) as of 03/21/2021 14:09  Ref. Range 03/21/2021 10:07  VITD Latest Ref Range: 30.00 - 100.00 ng/mL 47.50  TSH Latest Ref Range: 0.35 - 5.50 uIU/mL 1.54     ASSESSMENT / PLAN / RECOMMENDATIONS:   1. Hypothyroidism:   - Clinically he is feeling better  - No local neck symptoms  - He has been taking Levothyroxine appropriately  - TSH is normal   Medications   Continue Levothyroxine 150 mcg daily   2. Vitamin D Deficiency :  - He is on Vitamin D3 5000 iu every other day  - Vitamin D normal     F/U in 4 months    Signed electronically by: Mack Guise, MD  [facility]  [address] [facility] [address] [place], [place] [address] Phone: [phone] FAX: [phone]      CC: [place], Betty G, [place] [address] Phone: [phone]  Fax: [phone]   Return to Endocrinology clinic as below: No future appointments.

## 2021-03-21 ENCOUNTER — Ambulatory Visit (INDEPENDENT_AMBULATORY_CARE_PROVIDER_SITE_OTHER): Payer: BC Managed Care – PPO | Admitting: Internal Medicine

## 2021-03-21 ENCOUNTER — Other Ambulatory Visit: Payer: Self-pay

## 2021-03-21 ENCOUNTER — Other Ambulatory Visit (INDEPENDENT_AMBULATORY_CARE_PROVIDER_SITE_OTHER): Payer: BC Managed Care – PPO

## 2021-03-21 VITALS — BP 118/64 | HR 68 | Ht 72.0 in | Wt 239.0 lb

## 2021-03-21 DIAGNOSIS — E559 Vitamin D deficiency, unspecified: Secondary | ICD-10-CM | POA: Diagnosis not present

## 2021-03-21 DIAGNOSIS — E039 Hypothyroidism, unspecified: Secondary | ICD-10-CM

## 2021-03-21 LAB — TSH: TSH: 1.54 u[IU]/mL (ref 0.35–5.50)

## 2021-03-21 LAB — VITAMIN D 25 HYDROXY (VIT D DEFICIENCY, FRACTURES): VITD: 47.5 ng/mL (ref 30.00–100.00)

## 2021-03-21 MED ORDER — LEVOTHYROXINE SODIUM 150 MCG PO TABS: 150.0000 ug | ORAL_TABLET | Freq: Every day | ORAL | 3 refills | Status: DC

## 2021-03-21 NOTE — Patient Instructions (Signed)
-   Continue Levothyroxine 150 mcg daily for now  - Continue Vitamin D 5000 iu every other day

## 2021-07-31 NOTE — Progress Notes (Signed)
HPI: [name] is a 50 y.o.male here today for his routine physical examination.  Last CPE: 11/19/19 He lives with his wife and 3 children.   Regular exercise 3 or more times per week: He is active outdoor with his children and yard work. Following a healthy diet: Yes, a lot of vegetables, fruits, cooks at home most days.    Chronic medical problems: Vitamin D deficiency, hypothyroidism, neck pain, chronic fatigue, and seasonal allergies among some.   For thyroid disease and vit D def, he follows with endocrinologist.  Immunization History  Administered Date(s) Administered   Influenza,inj,Quad PF,6+ Mos 05/23/2021   Influenza-Unspecified 06/04/2017, 06/10/2018, 06/27/2019, 06/29/2020   PFIZER Comirnaty(Gray Top)Covid-19 Tri-Sucrose Vaccine 02/07/2021   PFIZER(Purple Top)SARS-COV-2 Vaccination 11/26/2019, 12/15/2019, 08/07/2020   Tdap 10/13/2017   Zoster Recombinat (Shingrix) 03/09/2021   Health Maintenance  Topic Date Due   COVID-19 Vaccine (5 - Booster for Pfizer series) 04/04/2021   Zoster Vaccines- Shingrix (2 of 2) 05/04/2021   HIV Screening  11/03/2024 (Originally 04/04/1986)   COLONOSCOPY (Pts 45-105yrs Insurance coverage will need to be confirmed)  10/03/2026   TETANUS/TDAP  10/14/2027   INFLUENZA VACCINE  Completed   Hepatitis C Screening  Completed   Pneumococcal Vaccine 43-20 Years old  Aged Out   HPV VACCINES  Aged Out   Last prostate ca screening: 11/19/19 0.48 Nocturia x 0. Sometimes urine dribbling and decreased urine stream. Negative for gross hematuria or dysuria.  -Negative for high alcohol intake or tobacco use.  Sleeping about 7-8 hours. No morning headaches.  Concerned about about loud snoring. No witnessed apnea. Sometimes he does not feel rested in the morning when he fists gets up.  -Concerns and/or follow up today:  For the past year he has had lightheadedness when doing bending picking up sticks from year for example. No associated  symptoms. He has no symptoms when plying basketball or with other activities. Hearing is not as good any more for the past year.  Right-sided lower back pain radiated to buttocks, with prolonged sitting RLE "little" tingling. Negative for saddle anesthesia, weakness, or bowel/bladder dysfunction.  Review of Systems  Constitutional:  Positive for fatigue. Negative for activity change, appetite change and fever.  HENT:  Negative for mouth sores, nosebleeds, sore throat and trouble swallowing.   Eyes:  Negative for redness and visual disturbance.  Respiratory:  Negative for cough, shortness of breath and wheezing.   Cardiovascular:  Negative for chest pain, palpitations and leg swelling.  Gastrointestinal:  Negative for abdominal pain, blood in stool, nausea and vomiting.  Endocrine: Negative for cold intolerance, heat intolerance, polydipsia, polyphagia and polyuria.  Genitourinary:  Negative for decreased urine volume, dysuria, genital sores, hematuria and testicular pain.  Musculoskeletal:  Positive for back pain. Negative for gait problem and joint swelling.  Skin:  Negative for color change and rash.  Allergic/Immunologic: Positive for environmental allergies.  Neurological:  Negative for syncope, facial asymmetry and weakness.  Hematological:  Negative for adenopathy. Does not bruise/bleed easily.  Psychiatric/Behavioral:  Positive for sleep disturbance (Sometimes, he takes Melatonin prn.). Negative for confusion.    Current Outpatient Medications on File Prior to Visit  Medication Sig Dispense Refill   cetirizine (ZYRTEC) 10 MG chewable tablet Chew 10 mg by mouth daily.     Cholecalciferol (VITAMIN D3 PO) Take 5,000 Units by mouth daily.     levothyroxine (SYNTHROID) 150 MCG tablet Take 1 tablet (150 mcg total) by mouth daily. 90 tablet 3   Multiple Vitamin (  MULTIVITAMIN PO) Take by mouth daily.     Multiple Vitamins-Minerals (ZINC PO) Take by mouth daily.     Vitamin D,  Ergocalciferol, (DRISDOL) 1.25 MG (50000 UNIT) CAPS capsule TAKE 1 CAPSULE BY MOUTH EVERY 3 WEEKS 4 capsule 3   fluticasone (FLONASE) 50 MCG/ACT nasal spray Place 1 spray into both nostrils 2 (two) times daily. (Patient not taking: Reported on 08/01/2021) 16 g 1   No current facility-administered medications on file prior to visit.     Past Medical History:  Diagnosis Date   Allergy    Arthritis    Chicken pox    Hyperbilirubinemia    Rosacea, unspecified    Follows with dermatologists.   Thyroid disease    hypo    Past Surgical History:  Procedure Laterality Date   ANTERIOR CERVICAL DECOMP/DISCECTOMY FUSION     TONSILLECTOMY     VASECTOMY      No Known Allergies  Family History  Problem Relation Age of Onset   Diabetes Mother    Hypertension Mother    Atrial fibrillation Mother    Multiple sclerosis Father    Cerebral palsy Sister    Atrial fibrillation Brother    Colon cancer Neg Hx    Esophageal cancer Neg Hx    Rectal cancer Neg Hx    Stomach cancer Neg Hx     Social History   Socioeconomic History   Marital status: Married    Spouse name: Not on file   Number of children: Not on file   Years of education: Not on file   Highest education level: Master's degree (e.g., MA, MS, MEng, MEd, MSW, MBA)  Occupational History   Not on file  Tobacco Use   Smoking status: Never   Smokeless tobacco: Never  Vaping Use   Vaping Use: Never used  Substance and Sexual Activity   Alcohol use: Yes    Comment: occasionally   Drug use: No   Sexual activity: Yes    Partners: Female  Other Topics Concern   Not on file  Social History Narrative   Not on file   Social Determinants of Health   Financial Resource Strain: Low Risk    Difficulty of Paying Living Expenses: Not hard at all  Food Insecurity: No Food Insecurity   Worried About Charity fundraiser in the Last Year: Never true   Ran Out of Food in the Last Year: Never true  Transportation Needs: No  Transportation Needs   Lack of Transportation (Medical): No   Lack of Transportation (Non-Medical): No  Physical Activity: Unknown   Days of Exercise per Week: Patient refused   Minutes of Exercise per Session: Not on file  Stress: Stress Concern Present   Feeling of Stress : To some extent  Social Connections: Moderately Integrated   Frequency of Communication with Friends and Family: Twice a week   Frequency of Social Gatherings with Friends and Family: More than three times a week   Attends Religious Services: More than 4 times per year   Active Member of Clubs or Organizations: No   Attends Archivist Meetings: Not on file   Marital Status: Married   Vitals:   08/01/21 0858  BP: 120/84  Pulse: 70  Resp: 16  Temp: 97.8 F (36.6 C)  SpO2: 99%   Body mass index is 33.61 kg/m.  Wt Readings from Last 3 Encounters:  08/01/21 247 lb 12.8 oz (112.4 kg)  03/21/21 239 lb (108.4 kg)  02/09/21 240 lb (108.9 kg)   Physical Exam Vitals and nursing note reviewed.  Constitutional:      General: He is not in acute distress.    Appearance: He is well-developed and well-groomed.  HENT:     Head: Normocephalic and atraumatic.     Right Ear: Hearing, tympanic membrane, ear canal and external ear normal.     Left Ear: Hearing, tympanic membrane, ear canal and external ear normal.     Mouth/Throat:     Mouth: Mucous membranes are moist.     Pharynx: Oropharynx is clear.  Eyes:     Extraocular Movements: Extraocular movements intact.     Conjunctiva/sclera: Conjunctivae normal.     Pupils: Pupils are equal, round, and reactive to light.  Neck:     Thyroid: No thyromegaly.     Trachea: No tracheal deviation.  Cardiovascular:     Rate and Rhythm: Normal rate and regular rhythm.     Pulses:          Dorsalis pedis pulses are 2+ on the right side and 2+ on the left side.     Heart sounds: No murmur heard. Pulmonary:     Effort: Pulmonary effort is normal. No respiratory  distress.     Breath sounds: Normal breath sounds.  Abdominal:     Palpations: Abdomen is soft. There is no hepatomegaly or mass.     Tenderness: There is no abdominal tenderness.  Genitourinary:    Comments: No concerns. Musculoskeletal:        General: No tenderness.     Cervical back: Normal range of motion.     Lumbar back: No tenderness. Negative right straight leg raise test and negative left straight leg raise test.     Comments: No major deformities appreciated and no signs of synovitis.  Lymphadenopathy:     Cervical: No cervical adenopathy.     Upper Body:     Right upper body: No supraclavicular adenopathy.     Left upper body: No supraclavicular adenopathy.  Skin:    General: Skin is warm.     Findings: No erythema.  Neurological:     Mental Status: He is alert and oriented to person, place, and time.     Cranial Nerves: No cranial nerve deficit.     Sensory: No sensory deficit.     Coordination: Coordination normal.     Gait: Gait normal.     Deep Tendon Reflexes:     Reflex Scores:      Bicep reflexes are 2+ on the right side and 2+ on the left side.      Patellar reflexes are 2+ on the right side and 2+ on the left side. Psychiatric:        Mood and Affect: Affect normal.   ASSESSMENT AND PLAN:  Mr.Anes was seen today for annual exam, dizziness, back pain and snoring.  Diagnoses and all orders for this visit: Orders Placed This Encounter  Procedures   Lipid panel   PSA   Comprehensive metabolic panel   Hemoglobin A1c   Vitamin B12   CBC   Ambulatory referral to Sleep Studies   Lab Results  Component Value Date   YIFOYDXA12 878 08/01/2021   Lab Results  Component Value Date   CHOL 181 08/01/2021   HDL 64.40 08/01/2021   LDLCALC 89 08/01/2021   TRIG 135.0 08/01/2021   CHOLHDL 3 08/01/2021   Lab Results  Component Value Date   WBC 6.3 08/01/2021  HGB 15.4 08/01/2021   HCT 46.0 08/01/2021   MCV 92.8 08/01/2021   PLT 219.0 08/01/2021    Lab Results  Component Value Date   PSA 0.72 08/01/2021   PSA 0.48 11/19/2019   Lab Results  Component Value Date   CREATININE 0.92 08/01/2021   BUN 17 08/01/2021   NA 141 08/01/2021   K 4.4 08/01/2021   CL 102 08/01/2021   CO2 29 08/01/2021   Lab Results  Component Value Date   ALT 29 08/01/2021   AST 24 08/01/2021   ALKPHOS 54 08/01/2021   BILITOT 1.8 (H) 08/01/2021   Lab Results  Component Value Date   HGBA1C 5.9 08/01/2021   Routine general medical examination at a health care facility We discussed the importance of regular physical activity and healthy diet for prevention of chronic illness and/or complications. Preventive guidelines reviewed. Vaccination up to date. Next CPE in a year.  The 10-year ASCVD risk score (Arnett DK, et al., 2019) is: 2.6%   Values used to calculate the score:     Age: 52 years     Sex: Male     Is Non-Hispanic African American: No     Diabetic: No     Tobacco smoker: No     Systolic Blood Pressure: 628 mmHg     Is BP treated: No     HDL Cholesterol: 64.4 mg/dL     Total Cholesterol: 181 mg/dL  Screening for lipoid disorders -     Lipid panel  Screening for endocrine, metabolic and immunity disorder -     Hemoglobin A1c  Prostate cancer screening -     PSA  Lightheadedness We discussed possible etiologies. Hx and examination today do not suggest a serious process. ? Vertigo. Adequate hydration. Further recommendations according to lab results. Instructed about warning signs.  Bilateral hearing loss, unspecified hearing loss type Hearing test normal today.  Hearing Screening   500Hz  1000Hz  2000Hz  4000Hz   Right ear Pass Pass Pass Pass  Left ear Pass Pass Pass Pass   Loud snoring Wt loss will help. Evaluation for OSA is warranted.  Numbness and tingling of right lower extremity With prolonged sitting. ? Radicular pain. Monitor for changes. Instructed about warning signs.  Acute right-sided low back pain,  unspecified whether sciatica present Mild and improving. I do not think imaging is needed today. Instructed about warning signs.  Return in 1 year (on 08/01/2022).  Anne-Marie Genson G. [place], MD  [facility]. [place] office.

## 2021-08-01 ENCOUNTER — Ambulatory Visit (INDEPENDENT_AMBULATORY_CARE_PROVIDER_SITE_OTHER): Payer: BC Managed Care – PPO | Admitting: Family Medicine

## 2021-08-01 ENCOUNTER — Encounter: Payer: Self-pay | Admitting: Family Medicine

## 2021-08-01 VITALS — BP 120/84 | HR 70 | Temp 97.8°F | Resp 16 | Ht 72.0 in | Wt 247.8 lb

## 2021-08-01 DIAGNOSIS — R0683 Snoring: Secondary | ICD-10-CM | POA: Diagnosis not present

## 2021-08-01 DIAGNOSIS — Z1329 Encounter for screening for other suspected endocrine disorder: Secondary | ICD-10-CM | POA: Diagnosis not present

## 2021-08-01 DIAGNOSIS — Z0000 Encounter for general adult medical examination without abnormal findings: Secondary | ICD-10-CM

## 2021-08-01 DIAGNOSIS — Z125 Encounter for screening for malignant neoplasm of prostate: Secondary | ICD-10-CM | POA: Diagnosis not present

## 2021-08-01 DIAGNOSIS — H9193 Unspecified hearing loss, bilateral: Secondary | ICD-10-CM

## 2021-08-01 DIAGNOSIS — Z130 Encounter for screening for diseases of the blood and blood-forming organs and certain disorders involving the immune mechanism: Secondary | ICD-10-CM

## 2021-08-01 DIAGNOSIS — Z13228 Encounter for screening for other metabolic disorders: Secondary | ICD-10-CM | POA: Diagnosis not present

## 2021-08-01 DIAGNOSIS — R42 Dizziness and giddiness: Secondary | ICD-10-CM

## 2021-08-01 DIAGNOSIS — M545 Low back pain, unspecified: Secondary | ICD-10-CM

## 2021-08-01 DIAGNOSIS — Z13220 Encounter for screening for lipoid disorders: Secondary | ICD-10-CM

## 2021-08-01 DIAGNOSIS — R202 Paresthesia of skin: Secondary | ICD-10-CM

## 2021-08-01 DIAGNOSIS — R200 Anesthesia of skin: Secondary | ICD-10-CM

## 2021-08-01 LAB — LIPID PANEL
Cholesterol: 181 mg/dL (ref 0–200)
HDL: 64.4 mg/dL (ref >39.00)
LDL Cholesterol: 89 mg/dL (ref 0–99)
NonHDL: 116.18
Total CHOL/HDL Ratio: 3
Triglycerides: 135 mg/dL (ref 0.0–149.0)
VLDL: 27 mg/dL (ref 0.0–40.0)

## 2021-08-01 LAB — COMPREHENSIVE METABOLIC PANEL
ALT: 29 U/L (ref 0–53)
AST: 24 U/L (ref 0–37)
Albumin: 4.6 g/dL (ref 3.5–5.2)
Alkaline Phosphatase: 54 U/L (ref 39–117)
BUN: 17 mg/dL (ref 6–23)
CO2: 29 mEq/L (ref 19–32)
Calcium: 9.6 mg/dL (ref 8.4–10.5)
Chloride: 102 mEq/L (ref 96–112)
Creatinine, Ser: 0.92 mg/dL (ref 0.40–1.50)
GFR: 95.76 mL/min (ref >60.00)
Glucose, Bld: 77 mg/dL (ref 70–99)
Potassium: 4.4 mEq/L (ref 3.5–5.1)
Sodium: 141 mEq/L (ref 135–145)
Total Bilirubin: 1.8 mg/dL — ABNORMAL HIGH (ref 0.2–1.2)
Total Protein: 7.3 g/dL (ref 6.0–8.3)

## 2021-08-01 LAB — CBC
HCT: 46 % (ref 39.0–52.0)
Hemoglobin: 15.4 g/dL (ref 13.0–17.0)
MCHC: 33.4 g/dL (ref 30.0–36.0)
MCV: 92.8 fl (ref 78.0–100.0)
Platelets: 219 10*3/uL (ref 150.0–400.0)
RBC: 4.96 Mil/uL (ref 4.22–5.81)
RDW: 13.3 % (ref 11.5–15.5)
WBC: 6.3 10*3/uL (ref 4.0–10.5)

## 2021-08-01 LAB — HEMOGLOBIN A1C: Hgb A1c MFr Bld: 5.9 % (ref 4.6–6.5)

## 2021-08-01 LAB — PSA: PSA: 0.72 ng/mL (ref 0.10–4.00)

## 2021-08-01 LAB — VITAMIN B12: Vitamin B-12: 513 pg/mL (ref 211–911)

## 2021-08-01 NOTE — Patient Instructions (Addendum)
A few things to remember from today's visit:  Routine general medical examination at a health care facility  Screening for lipoid disorders - Plan: Lipid panel  Screening for endocrine, metabolic and immunity disorder - Plan: Hemoglobin A1c  Prostate cancer screening - Plan: PSA  Lightheadedness - Plan: CBC, Comprehensive metabolic panel  Bilateral hearing loss, unspecified hearing loss type  Loud snoring - Plan: Ambulatory referral to Sleep Studies  Numbness and tingling of right lower extremity - Plan: Vitamin B12  Acute right-sided low back pain, unspecified whether sciatica present  Do not use My Chart to request refills or for acute issues that need immediate attention.   Back pain seems to be muscular, could be causing leg tingling. If not better we may need imaging.  Please be sure medication list is accurate. If a new problem present, please set up appointment sooner than planned today.  Health Maintenance, Male Adopting a healthy lifestyle and getting preventive care are important in promoting health and wellness. Ask your health care provider about: The right schedule for you to have regular tests and exams. Things you can do on your own to prevent diseases and keep yourself healthy. What should I know about diet, weight, and exercise? Eat a healthy diet  Eat a diet that includes plenty of vegetables, fruits, low-fat dairy products, and lean protein. Do not eat a lot of foods that are high in solid fats, added sugars, or sodium. Maintain a healthy weight Body mass index (BMI) is a measurement that can be used to identify possible weight problems. It estimates body fat based on height and weight. Your health care provider can help determine your BMI and help you achieve or maintain a healthy weight. Get regular exercise Get regular exercise. This is one of the most important things you can do for your health. Most adults should: Exercise for at least 150 minutes each  week. The exercise should increase your heart rate and make you sweat (moderate-intensity exercise). Do strengthening exercises at least twice a week. This is in addition to the moderate-intensity exercise. Spend less time sitting. Even light physical activity can be beneficial. Watch cholesterol and blood lipids Have your blood tested for lipids and cholesterol at 52 years of age, then have this test every 5 years. You may need to have your cholesterol levels checked more often if: Your lipid or cholesterol levels are high. You are older than 52 years of age. You are at high risk for heart disease. What should I know about cancer screening? Many types of cancers can be detected early and may often be prevented. Depending on your health history and family history, you may need to have cancer screening at various ages. This may include screening for: Colorectal cancer. Prostate cancer. Skin cancer. Lung cancer. What should I know about heart disease, diabetes, and high blood pressure? Blood pressure and heart disease High blood pressure causes heart disease and increases the risk of stroke. This is more likely to develop in people who have high blood pressure readings or are overweight. Talk with your health care provider about your target blood pressure readings. Have your blood pressure checked: Every 3-5 years if you are 77-16 years of age. Every year if you are 42 years old or older. If you are between the ages of 64 and 55 and are a current or former smoker, ask your health care provider if you should have a one-time screening for abdominal aortic aneurysm (AAA). Diabetes Have regular diabetes screenings.  This checks your fasting blood sugar level. Have the screening done: Once every three years after age 74 if you are at a normal weight and have a low risk for diabetes. More often and at a younger age if you are overweight or have a high risk for diabetes. What should I know about  preventing infection? Hepatitis B If you have a higher risk for hepatitis B, you should be screened for this virus. Talk with your health care provider to find out if you are at risk for hepatitis B infection. Hepatitis C Blood testing is recommended for: Everyone born from [birth year] through 1967. Anyone with known risk factors for hepatitis C. Sexually transmitted infections (STIs) You should be screened each year for STIs, including gonorrhea and chlamydia, if: You are sexually active and are younger than 52 years of age. You are older than 52 years of age and your health care provider tells you that you are at risk for this type of infection. Your sexual activity has changed since you were last screened, and you are at increased risk for chlamydia or gonorrhea. Ask your health care provider if you are at risk. Ask your health care provider about whether you are at high risk for HIV. Your health care provider may recommend a prescription medicine to help prevent HIV infection. If you choose to take medicine to prevent HIV, you should first get tested for HIV. You should then be tested every 3 months for as long as you are taking the medicine. Follow these instructions at home: Alcohol use Do not drink alcohol if your health care provider tells you not to drink. If you drink alcohol: Limit how much you have to 0-2 drinks a day. Know how much alcohol is in your drink. In the [place] one drink equals one 12 oz bottle of beer (355 mL), one 5 oz glass of wine (148 mL), or one 1 oz glass of hard liquor (44 mL). Lifestyle Do not use any products that contain nicotine or tobacco. These products include cigarettes, chewing tobacco, and vaping devices, such as e-cigarettes. If you need help quitting, ask your health care provider. Do not use street drugs. Do not share needles. Ask your health care provider for help if you need support or information about quitting drugs. General instructions Schedule  regular health, dental, and eye exams. Stay current with your vaccines. Tell your health care provider if: You often feel depressed. You have ever been abused or do not feel safe at home. Summary Adopting a healthy lifestyle and getting preventive care are important in promoting health and wellness. Follow your health care provider's instructions about healthy diet, exercising, and getting tested or screened for diseases. Follow your health care provider's instructions on monitoring your cholesterol and blood pressure. This information is not intended to replace advice given to you by your health care provider. Make sure you discuss any questions you have with your health care provider. Document Revised: 01/08/2021 Document Reviewed: 01/08/2021 Elsevier Patient Education  [place].

## 2021-08-06 ENCOUNTER — Telehealth: Payer: Self-pay

## 2021-08-06 DIAGNOSIS — S81812A Laceration without foreign body, left lower leg, initial encounter: Secondary | ICD-10-CM | POA: Diagnosis not present

## 2021-08-06 DIAGNOSIS — Z23 Encounter for immunization: Secondary | ICD-10-CM | POA: Diagnosis not present

## 2021-08-06 NOTE — Telephone Encounter (Signed)
Caller says that he got a cut on his shin yesterday that bled a lot. He treated a bit and kept leg elevated and it stopped the bleeding, but it starts seeping again soon after he starts moving around. Will he need stitches?--Wound is gaping.   08/06/2021 10:14:59 AM Go to ED Now Radford Pax, RN, [place] Garnet  Comments User: Susie Cassette, RN Date/Time Eilene Ghazi Time): 08/06/2021 10:17:13 AM Advised to seek care at [facility] if there was available to place sutures. Advised to go to ED if unable to find UC.  Referrals GO TO FACILITY UNDECIDED

## 2021-09-19 ENCOUNTER — Ambulatory Visit: Payer: BC Managed Care – PPO | Admitting: Internal Medicine

## 2021-09-27 ENCOUNTER — Encounter: Payer: Self-pay | Admitting: Internal Medicine

## 2021-09-27 ENCOUNTER — Other Ambulatory Visit: Payer: Self-pay

## 2021-09-27 ENCOUNTER — Ambulatory Visit (INDEPENDENT_AMBULATORY_CARE_PROVIDER_SITE_OTHER): Payer: BC Managed Care – PPO | Admitting: Internal Medicine

## 2021-09-27 VITALS — BP 112/76 | HR 68 | Ht 72.0 in | Wt 241.0 lb

## 2021-09-27 DIAGNOSIS — E039 Hypothyroidism, unspecified: Secondary | ICD-10-CM | POA: Diagnosis not present

## 2021-09-27 DIAGNOSIS — E559 Vitamin D deficiency, unspecified: Secondary | ICD-10-CM | POA: Insufficient documentation

## 2021-09-27 DIAGNOSIS — R7303 Prediabetes: Secondary | ICD-10-CM

## 2021-09-27 LAB — VITAMIN D 25 HYDROXY (VIT D DEFICIENCY, FRACTURES): VITD: 39.73 ng/mL (ref 30.00–100.00)

## 2021-09-27 LAB — TSH: TSH: 0.83 u[IU]/mL (ref 0.35–5.50)

## 2021-09-27 NOTE — Patient Instructions (Signed)

## 2021-09-27 NOTE — Progress Notes (Signed)
Name: Daniel Juarez  MRN/ DOB: [id number], November 12, 1970    Age/ Sex: 51 y.o., male     PCP: Martinique, Betty G, MD   Reason for Endocrinology Evaluation: Hypothyroidism     Initial Endocrinology Clinic Visit: 08/09/2020    PATIENT IDENTIFIER: Daniel Juarez is a 51 y.o., male with a past medical history of hypothyroidism, environmental allergies and Vitamin D deficiency. He has followed with [facility] since 08/09/2020 for consultative assistance with management of his hypothyroidism.   HISTORICAL SUMMARY:   He has been diagnosed with hypothyroidism in 2008. Baseline thyroid levels not available. He has been on LT-4 replacement since then.      In 2012 he was diagnosed with Vitamin B12 and D deficiency   In  Review of his records he was seen by Dr. Elyse Hsu in 2018, per notes he was evaluated in 2014 for cognitive symptoms and fatigue . There was some suspicion of depression and multiple situation stresses at the time,  as all his labs were normal at the time.     He was having issues with anxiety, confusion, loose stools that he attributed to low normal TSH level, we reduced his levothyroxine from 175 to 150 mcg daily   He had concerns about feet stress fractures, but his Alk. Phos, Calcium, vitamin D , PTH , Mg and phos as well as DXA scan were all normal.     SUBJECTIVE:     Today (09/27/2021):  Daniel Juarez is here for a follow up on hypothyroidism  His weight continues to fluctuate  Denies diarrhea  Denies palpitations  Anxiety has improved,  continues with occasional fogginess  Denies local neck symptoms     Levothyroxine 150 mcg daily  Takes Vitamin D 5000 EVERY OTHER DAY.     HISTORY:  Past Medical History:  Past Medical History:  Diagnosis Date   Allergy    Arthritis    Chicken pox    Hyperbilirubinemia    Rosacea, unspecified    Follows with dermatologists.   Thyroid disease    hypo   Past Surgical History:  Past Surgical  History:  Procedure Laterality Date   ANTERIOR CERVICAL DECOMP/DISCECTOMY FUSION     TONSILLECTOMY     VASECTOMY     Social History:  reports that he has never smoked. He has never used smokeless tobacco. He reports current alcohol use. He reports that he does not use drugs. Family History:  Family History  Problem Relation Age of Onset   Diabetes Mother    Hypertension Mother    Atrial fibrillation Mother    Multiple sclerosis Father    Cerebral palsy Sister    Atrial fibrillation Brother    Colon cancer Neg Hx    Esophageal cancer Neg Hx    Rectal cancer Neg Hx    Stomach cancer Neg Hx      HOME MEDICATIONS: Allergies as of 09/27/2021   No Known Allergies      Medication List        Accurate as of September 27, 2021 10:00 AM. If you have any questions, ask your nurse or doctor.          STOP taking these medications    fluticasone 50 MCG/ACT nasal spray Commonly known as: Flonase Stopped by: Dorita Sciara, MD   ZyrTEC 10 MG chewable tablet Generic drug: cetirizine Stopped by: Dorita Sciara, MD       TAKE these medications    levothyroxine  150 MCG tablet Commonly known as: SYNTHROID Take 1 tablet (150 mcg total) by mouth daily.   MULTIVITAMIN PO Take by mouth daily.   Vitamin D (Ergocalciferol) 1.25 MG (50000 UNIT) Caps capsule Commonly known as: DRISDOL TAKE 1 CAPSULE BY MOUTH EVERY 3 WEEKS   VITAMIN D3 PO Take 5,000 Units by mouth daily.          OBJECTIVE:   PHYSICAL EXAM: VS: BP 112/76 (BP Location: Left Arm, Patient Position: Sitting, Cuff Size: Small)    Pulse 68    Ht 6' (1.829 m)    Wt 241 lb (109.3 kg)    SpO2 94%    BMI 32.69 kg/m    EXAM: General: Pt appears well and is in NAD  Neck: General: Supple without adenopathy. Thyroid: Thyroid size normal.  No goiter or nodules appreciated.  Lungs: Clear with good BS bilat with no rales, rhonchi, or wheezes  Heart: Auscultation: RRR.  Abdomen: Normoactive bowel  sounds, soft, nontender, without masses or organomegaly palpable  Extremities:  BL LE: No pretibial edema normal ROM and strength.  Mental Status: Judgment, insight: Intact Orientation: Oriented to time, place, and person Mood and affect: No depression, anxiety, or agitation     DATA REVIEWED:   Latest Reference Range & Units 09/27/21 10:23  TSH 0.35 - 5.50 uIU/mL 0.83     Latest Reference Range & Units 09/27/21 10:23  VITD 30.00 - 100.00 ng/mL 39.73    ASSESSMENT / PLAN / RECOMMENDATIONS:   1. Hypothyroidism:   - Clinically he is feeling better  - No local neck symptoms  - He has been taking Levothyroxine appropriately  - TSH is normal   Medications   Continue Levothyroxine 150 mcg daily   2. Vitamin D Deficiency :  - He is on Vitamin D3 5000 iu every other day  - Vitamin D normal    3. Pre-diabetes   - A1c 5.9 % , we discussed low carb diet with exercise 150-175 minutes per week     F/U in 4 months    Signed electronically by: Mack Guise, MD  [facility]  [facility] [address] [place], [place] [address] Phone: [phone] FAX: [phone]      CC: Martin[place], Betty G, [address] [place] [address] Phone: [phone]  Fax: [phone]   Return to Endocrinology clinic as below: Future Appointments  Date Time Provider [place]  10/18/2021 12:45 PM Star Age, MD [facility] None

## 2021-09-28 MED ORDER — LEVOTHYROXINE SODIUM 150 MCG PO TABS
150.0000 ug | ORAL_TABLET | Freq: Every day | ORAL | 3 refills | Status: DC
Start: 2021-09-28 — End: 2022-08-21

## 2021-10-18 ENCOUNTER — Institutional Professional Consult (permissible substitution): Payer: BC Managed Care – PPO | Admitting: Neurology

## 2021-10-25 ENCOUNTER — Other Ambulatory Visit: Payer: Self-pay | Admitting: Family Medicine

## 2021-10-25 DIAGNOSIS — E559 Vitamin D deficiency, unspecified: Secondary | ICD-10-CM

## 2021-10-29 ENCOUNTER — Other Ambulatory Visit: Payer: Self-pay | Admitting: Family Medicine

## 2022-01-16 NOTE — Progress Notes (Unsigned)
ACUTE VISIT Chief Complaint  Patient presents with   Heartburn    Ongoing x 2 months, happens after eating. Also happens after working outside & bending.    prostate    X 2 months, getting up more frequently at night, feels like not emptying bladder completely, light stream/dribble.   HPI: [name] is a 51 y.o. male, who is here today with above complaints. Urinary frequency as described above. Urgency and urine dribbling. He has not noted changes in urine stream. He does not feel like he is completely emptying his bladder, tenesmus.  Negative for dysuria,gross hematuria,or decreased urine output. In 01/2021 he was evaluated for left testicular pain, resolved with abx treatment. He has not noted testicular masses, urethral discharge,rectal or pelvic pain.  Lab Results  Component Value Date   PSA 0.72 08/01/2021   PSA 0.48 11/19/2019   Lab Results  Component Value Date   HGBA1C 5.9 08/01/2021   Epigastric abdominal burning pain, intermittent, 4/10, not radiated.  Heartburn He complains of heartburn. He reports no chest pain. This is a new problem. The current episode started more than 1 month ago. The problem occurs occasionally. The problem has been unchanged. Nothing aggravates the symptoms. Pertinent negatives include no anemia, melena, muscle weakness, orthopnea or weight loss. He has tried a histamine-2 antagonist for the symptoms. The treatment provided mild relief.  He has not identified exacerbating or alleviating factors. Some times even w/o eating food. No associated nausea,vomiting,or changes in bowel habits. Pepto bismol, zantac help temporarely.  Review of Systems  Constitutional:  Negative for chills, fever and weight loss.  Cardiovascular:  Negative for chest pain, palpitations and leg swelling.  Gastrointestinal:  Positive for heartburn. Negative for abdominal distention, blood in stool and melena.  Endocrine: Negative for polydipsia,  polyphagia and polyuria.  Musculoskeletal:  Negative for muscle weakness.  Neurological:  Negative for syncope and weakness.  Hematological:  Negative for adenopathy. Does not bruise/bleed easily.  Rest see pertinent positives and negatives per HPI.  Current Outpatient Medications on File Prior to Visit  Medication Sig Dispense Refill   levothyroxine (SYNTHROID) 150 MCG tablet Take 1 tablet (150 mcg total) by mouth daily. 90 tablet 3   Multiple Vitamin (MULTIVITAMIN PO) Take by mouth daily.     Vitamin D, Ergocalciferol, (DRISDOL) 1.25 MG (50000 UNIT) CAPS capsule TAKE 1 CAPSULE BY MOUTH EVERY 3 WEEKS 4 capsule 3   No current facility-administered medications on file prior to visit.   Past Medical History:  Diagnosis Date   Allergy    Arthritis    Chicken pox    Hyperbilirubinemia    Rosacea, unspecified    Follows with dermatologists.   Thyroid disease    hypo   No Known Allergies  Social History   Socioeconomic History   Marital status: Married    Spouse name: Not on file   Number of children: Not on file   Years of education: Not on file   Highest education level: Master's degree (e.g., MA, MS, MEng, MEd, MSW, MBA)  Occupational History   Not on file  Tobacco Use   Smoking status: Never   Smokeless tobacco: Never  Vaping Use   Vaping Use: Never used  Substance and Sexual Activity   Alcohol use: Yes    Comment: occasionally   Drug use: No   Sexual activity: Yes    Partners: Female  Other Topics Concern   Not on file  Social History Narrative   Not  on file   Social Determinants of Health   Financial Resource Strain: Low Risk    Difficulty of Paying Living Expenses: Not hard at all  Food Insecurity: No Food Insecurity   Worried About Charity fundraiser in the Last Year: Never true   Arboriculturist in the Last Year: Never true  Transportation Needs: No Transportation Needs   Lack of Transportation (Medical): No   Lack of Transportation (Non-Medical): No   Physical Activity: Unknown   Days of Exercise per Week: Patient refused   Minutes of Exercise per Session: Not on file  Stress: Stress Concern Present   Feeling of Stress : To some extent  Social Connections: Moderately Integrated   Frequency of Communication with Friends and Family: Twice a week   Frequency of Social Gatherings with Friends and Family: More than three times a week   Attends Religious Services: More than 4 times per year   Active Member of [employer] or Organizations: No   Attends Archivist Meetings: Not on file   Marital Status: Married   Vitals:   01/18/22 1121  BP: 126/80  Pulse: 77  Resp: 16  Temp: 98.7 F (37.1 C)  SpO2: 99%   Body mass index is 32.62 kg/m.  Physical Exam Vitals and nursing note reviewed. Exam conducted with a chaperone present.  Constitutional:      General: He is not in acute distress.    Appearance: He is well-developed.  HENT:     Head: Normocephalic and atraumatic.  Eyes:     Conjunctiva/sclera: Conjunctivae normal.  Cardiovascular:     Rate and Rhythm: Normal rate and regular rhythm.     Heart sounds: No murmur heard. Pulmonary:     Effort: Pulmonary effort is normal. No respiratory distress.     Breath sounds: Normal breath sounds.  Abdominal:     Palpations: Abdomen is soft. There is no mass.     Tenderness: There is no abdominal tenderness. There is no right CVA tenderness or left CVA tenderness.  Genitourinary:    Prostate: Enlarged (Mainly right lobe). Not tender and no nodules present.     Rectum: No mass or tenderness.  Lymphadenopathy:     Lower Body: No right inguinal adenopathy. No left inguinal adenopathy.  Skin:    General: Skin is warm.     Findings: No erythema.  Neurological:     Mental Status: He is alert and oriented to person, place, and time.   ASSESSMENT AND PLAN:  [name] was seen today for heartburn and prostate.  Diagnoses and all orders for this visit: Orders Placed This Encounter   Procedures   PSA   Urinalysis with Culture Reflex   Hemoglobin A1c   REFLEXIVE URINE CULTURE   Lab Results  Component Value Date   PSA 0.64 01/18/2022             Lab Results  Component Value Date   HGBA1C 5.9 01/18/2022   Urinary frequency We discussed possible etiologies. Symptoms are suggestive of prostate disease. UA with reflex Cx ordered, further recommendations will be given according to results. Monitor for new symptoms.  Gastroesophageal reflux disease, unspecified whether esophagitis present Differential Dx's discussed. Omeprazole 40 mg daily before breakfast for 6-8 weeks. GERD precautions also recommended.  -     omeprazole (PRILOSEC) 40 MG capsule; Take 1 capsule (40 mg total) by mouth daily.  BPH with obstruction/lower urinary tract symptoms We discussed Dx,prognosis,and treatment options. He agrees with trying  Flomax 0.4 mg daily, we reviewed some side effects.  -     tamsulosin (FLOMAX) 0.4 MG CAPS capsule; Take 1 capsule (0.4 mg total) by mouth daily.  I spent a total of 37 minutes in both face to face and non face to face activities for this visit on the date of this encounter. During this time history was obtained and documented, examination was performed, prior labs reviewed, and assessment/plan discussed.  Return in about 8 weeks (around 03/15/2022).  Cicilia Clinger G. Martinique, MD  [facility]. [place] office.

## 2022-01-18 ENCOUNTER — Ambulatory Visit (INDEPENDENT_AMBULATORY_CARE_PROVIDER_SITE_OTHER): Payer: BC Managed Care – PPO | Admitting: Family Medicine

## 2022-01-18 ENCOUNTER — Encounter: Payer: Self-pay | Admitting: Family Medicine

## 2022-01-18 VITALS — BP 126/80 | HR 77 | Temp 98.7°F | Resp 16 | Ht 72.0 in | Wt 240.5 lb

## 2022-01-18 DIAGNOSIS — K219 Gastro-esophageal reflux disease without esophagitis: Secondary | ICD-10-CM

## 2022-01-18 DIAGNOSIS — N401 Enlarged prostate with lower urinary tract symptoms: Secondary | ICD-10-CM

## 2022-01-18 DIAGNOSIS — R350 Frequency of micturition: Secondary | ICD-10-CM | POA: Diagnosis not present

## 2022-01-18 DIAGNOSIS — N138 Other obstructive and reflux uropathy: Secondary | ICD-10-CM | POA: Diagnosis not present

## 2022-01-18 LAB — PSA: PSA: 0.64 ng/mL (ref 0.10–4.00)

## 2022-01-18 LAB — HEMOGLOBIN A1C: Hgb A1c MFr Bld: 5.9 % (ref 4.6–6.5)

## 2022-01-18 MED ORDER — OMEPRAZOLE 40 MG PO CPDR: 40.0000 mg | DELAYED_RELEASE_CAPSULE | Freq: Every day | ORAL | 1 refills | Status: DC

## 2022-01-18 MED ORDER — TAMSULOSIN HCL 0.4 MG PO CAPS: 0.4000 mg | ORAL_CAPSULE | Freq: Every day | ORAL | 1 refills | Status: DC

## 2022-01-18 NOTE — Patient Instructions (Addendum)
A few things to remember from today's visit:  Urinary frequency - Plan: PSA, Urinalysis with Culture Reflex, CANCELED: Urinalysis, Routine w reflex microscopic  Gastroesophageal reflux disease, unspecified whether esophagitis present - Plan: omeprazole (PRILOSEC) 40 MG capsule  BPH with obstruction/lower urinary tract symptoms - Plan: tamsulosin (FLOMAX) 0.4 MG CAPS capsule  If you need refills please call your pharmacy. Do not use My Chart to request refills or for acute issues that need immediate attention.  Flomax 1 tab at bedtime. Omeprazole 40 mg 30 min before breakfast.  Please be sure medication list is accurate. If a new problem present, please set up appointment sooner than planned today.   Benign Prostatic Hyperplasia  Benign prostatic hyperplasia (BPH) is an enlarged prostate gland that is caused by the normal aging process. The prostate may get bigger as a man gets older. The condition is not caused by cancer. The prostate is a walnut-sized gland that is involved in the production of semen. It is located in front of the rectum and below the bladder. The bladder stores urine. The urethra carries stored urine out of the body. An enlarged prostate can press on the urethra. This can make it harder to pass urine. The buildup of urine in the bladder can cause infection. Back pressure and infection may progress to bladder damage and kidney (renal) failure. What are the causes? This condition is part of the normal aging process. However, not all men develop problems from this condition. If the prostate enlarges away from the urethra, urine flow will not be blocked. If it enlarges toward the urethra and compresses it, there will be problems passing urine. What increases the risk? This condition is more likely to develop in men older than 50 years. What are the signs or symptoms? Symptoms of this condition include: Getting up often during the night to urinate. Needing to urinate  frequently during the day. Difficulty starting urine flow. Decrease in size and strength of your urine stream. Leaking (dribbling) after urinating. Inability to pass urine. This needs immediate treatment. Inability to completely empty your bladder. Pain when you pass urine. This is more common if there is also an infection. Urinary tract infection (UTI). How is this diagnosed? This condition is diagnosed based on your medical history, a physical exam, and your symptoms. Tests will also be done, such as: A post-void bladder scan. This measures any amount of urine that may remain in your bladder after you finish urinating. A digital rectal exam. In a rectal exam, your health care provider checks your prostate by putting a lubricated, gloved finger into your rectum to feel the back of your prostate gland. This exam detects the size of your gland and any abnormal lumps or growths. An exam of your urine (urinalysis). A prostate specific antigen (PSA) screening. This is a blood test used to screen for prostate cancer. An ultrasound. This test uses sound waves to electronically produce a picture of your prostate gland. Your health care provider may refer you to a specialist in kidney and prostate diseases (urologist). How is this treated? Once symptoms begin, your health care provider will monitor your condition (active surveillance or watchful waiting). Treatment for this condition will depend on the severity of your condition. Treatment may include: Observation and yearly exams. This may be the only treatment needed if your condition and symptoms are mild. Medicines to relieve your symptoms, including: Medicines to shrink the prostate. Medicines to relax the muscle of the prostate. Surgery in severe cases. Surgery  may include: Prostatectomy. In this procedure, the prostate tissue is removed completely through an open incision or with a laparoscope or robotics. Transurethral resection of the  prostate (TURP). In this procedure, a tool is inserted through the opening at the tip of the penis (urethra). It is used to cut away tissue of the inner core of the prostate. The pieces are removed through the same opening of the penis. This removes the blockage. Transurethral incision (TUIP). In this procedure, small cuts are made in the prostate. This lessens the prostate's pressure on the urethra. Transurethral microwave thermotherapy (TUMT). This procedure uses microwaves to create heat. The heat destroys and removes a small amount of prostate tissue. Transurethral needle ablation (TUNA). This procedure uses radio frequencies to destroy and remove a small amount of prostate tissue. Interstitial laser coagulation ([place]). This procedure uses a laser to destroy and remove a small amount of prostate tissue. Transurethral electrovaporization (TUVP). This procedure uses electrodes to destroy and remove a small amount of prostate tissue. Prostatic urethral lift. This procedure inserts an implant to push the lobes of the prostate away from the urethra. Follow these instructions at home: Take over-the-counter and prescription medicines only as told by your health care provider. Monitor your symptoms for any changes. Contact your health care provider with any changes. Avoid drinking large amounts of liquid before going to bed or out in public. Avoid or reduce how much caffeine or alcohol you drink. Give yourself time when you urinate. Keep all follow-up visits. This is important. Contact a health care provider if: You have unexplained back pain. Your symptoms do not get better with treatment. You develop side effects from the medicine you are taking. Your urine becomes very dark or has a bad smell. Your lower abdomen becomes distended and you have trouble passing urine. Get help right away if: You have a fever or chills. You suddenly cannot urinate. You feel light-headed or very dizzy, or you  faint. There are large amounts of blood or clots in your urine. Your urinary problems become hard to manage. You develop moderate to severe low back or flank pain. The flank is the side of your body between the ribs and the hip. These symptoms may be an emergency. Get help right away. Call 911. Do not wait to see if the symptoms will go away. Do not drive yourself to the hospital. Summary Benign prostatic hyperplasia (BPH) is an enlarged prostate that is caused by the normal aging process. It is not caused by cancer. An enlarged prostate can press on the urethra. This can make it hard to pass urine. This condition is more likely to develop in men older than 50 years. Get help right away if you suddenly cannot urinate. This information is not intended to replace advice given to you by your health care provider. Make sure you discuss any questions you have with your health care provider. Document Revised: 03/07/2021 Document Reviewed: 03/07/2021 Elsevier Patient Education  [place].

## 2022-01-19 ENCOUNTER — Encounter: Payer: Self-pay | Admitting: Family Medicine

## 2022-01-19 LAB — URINALYSIS W MICROSCOPIC + REFLEX CULTURE
Bacteria, UA: NONE SEEN /HPF
Bilirubin Urine: NEGATIVE
Glucose, UA: NEGATIVE
Hgb urine dipstick: NEGATIVE
Hyaline Cast: NONE SEEN /LPF
Ketones, ur: NEGATIVE
Leukocyte Esterase: NEGATIVE
Nitrites, Initial: NEGATIVE
Protein, ur: NEGATIVE
RBC / HPF: NONE SEEN /HPF (ref 0–2)
Specific Gravity, Urine: 1.007 (ref 1.001–1.035)
Squamous Epithelial / HPF: NONE SEEN /HPF (ref <=5)
WBC, UA: NONE SEEN /HPF (ref 0–5)
pH: 5.5 (ref 5.0–8.0)

## 2022-01-19 LAB — NO CULTURE INDICATED

## 2022-01-29 IMAGING — DX DG HIP (WITH OR WITHOUT PELVIS) 2-3V*L*
2 series · 2 of 2 positions shown · non-contrast
Comparison: None.

CLINICAL DATA: Anterior left hip pain exacerbated by walking. No
known injury.

EXAM:
DG HIP (WITH OR WITHOUT PELVIS) 2-3V LEFT

[pelvis ap]
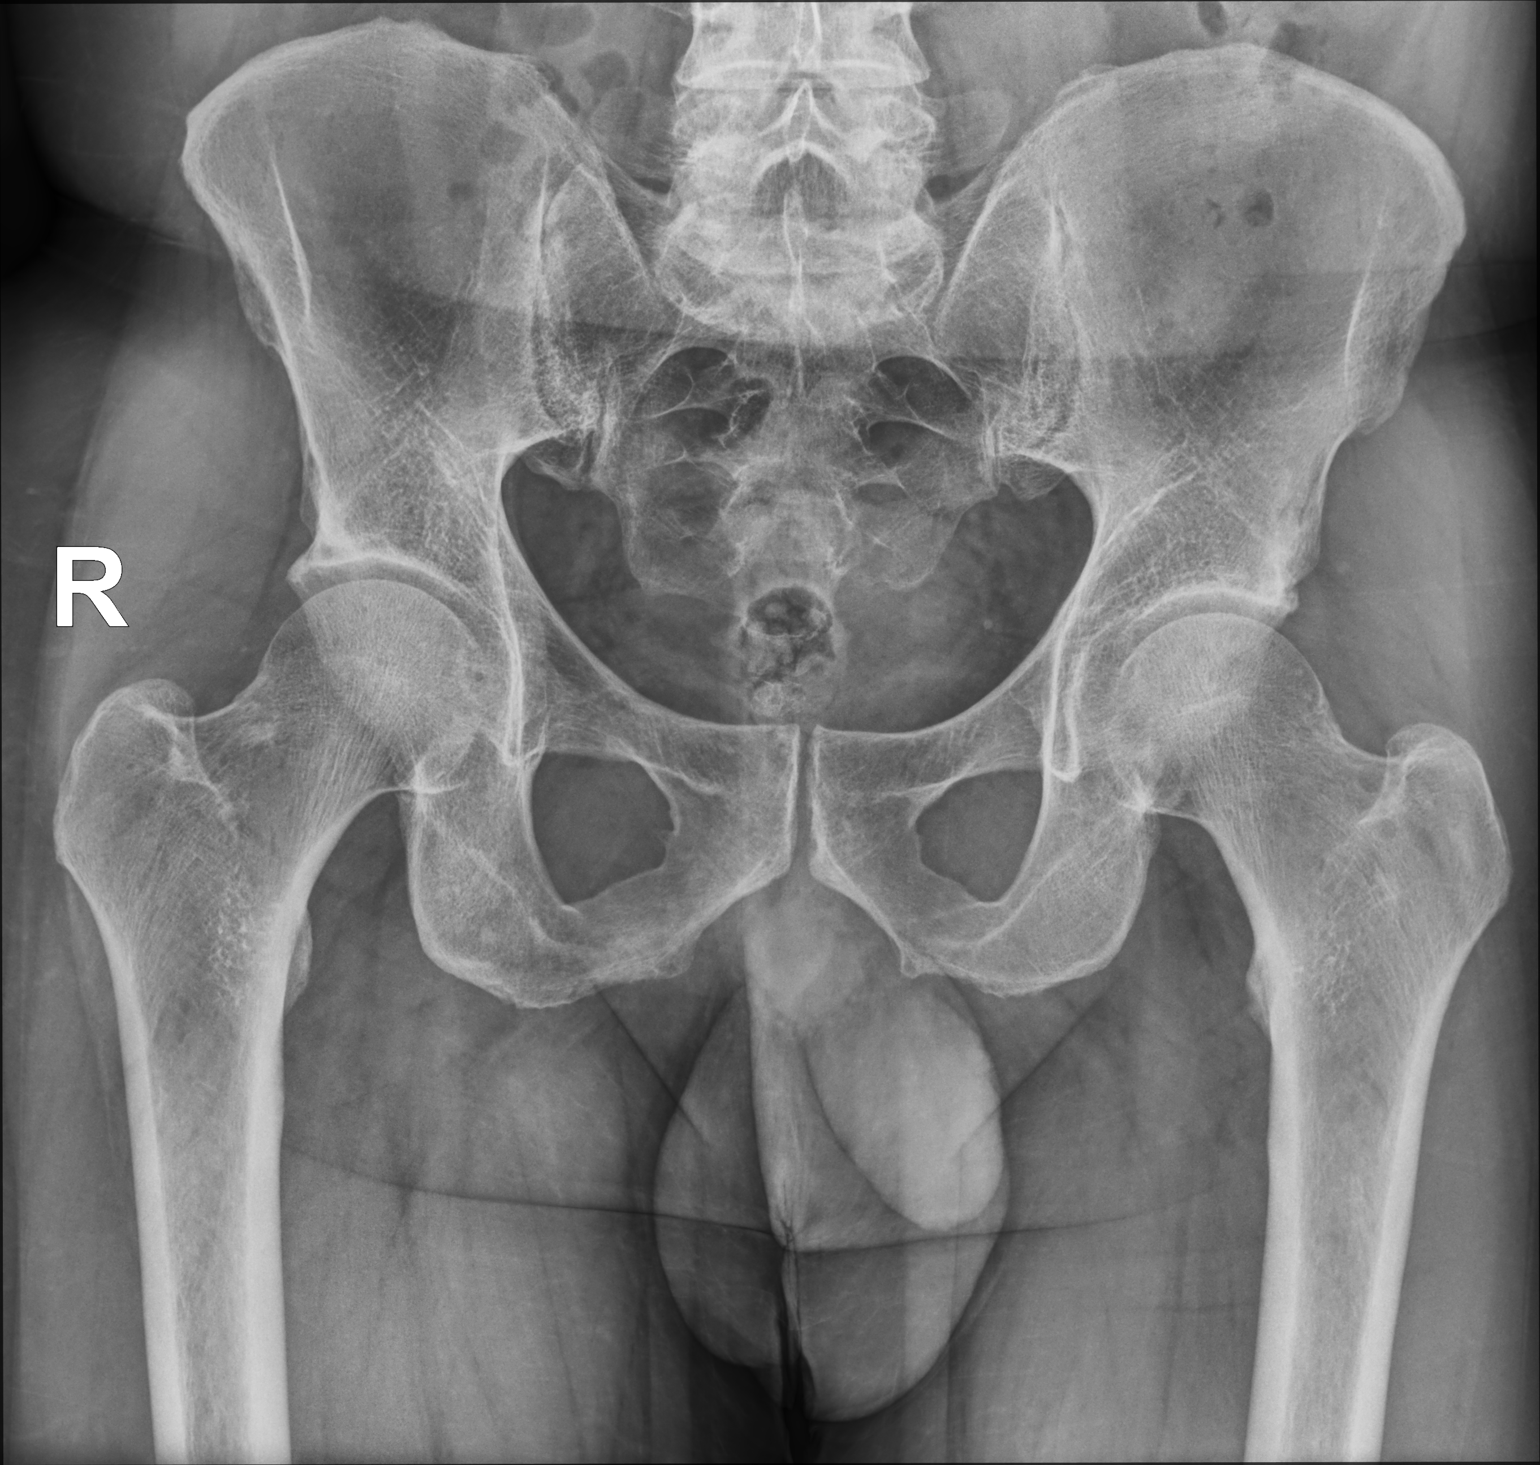

[hip (frog leg) lat]
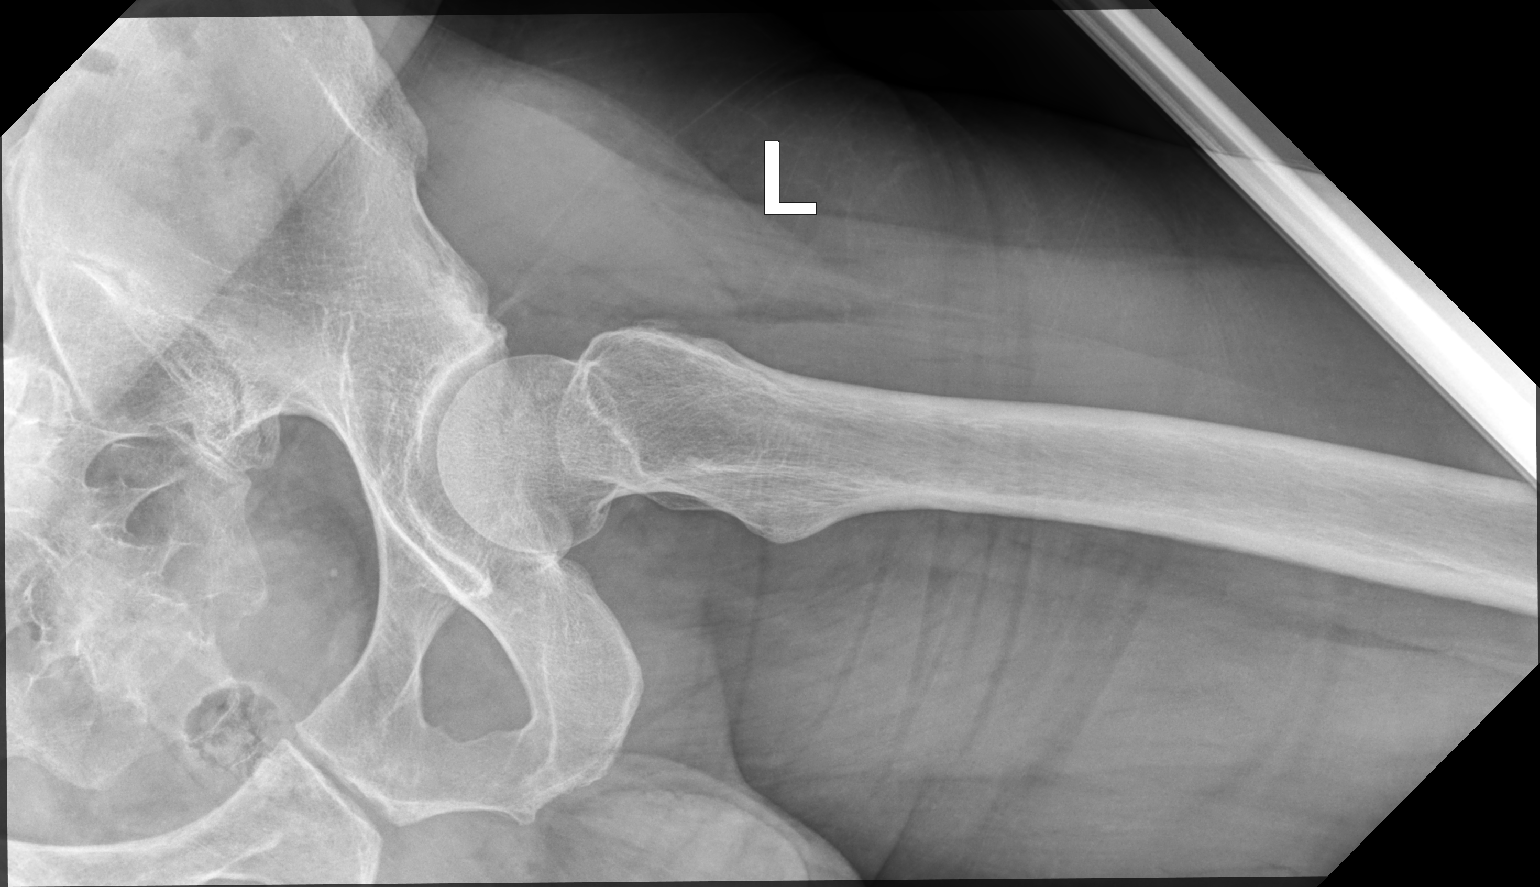

[2 of 2 positions shown; findings below may reference images not displayed]

FINDINGS: No fracture or bone lesion.

Hip joints, SI joints and symphysis pubis are normally spaced and
aligned. No arthropathic changes.

Normal soft tissues.
IMPRESSION: Negative.

## 2022-02-09 ENCOUNTER — Other Ambulatory Visit: Payer: Self-pay | Admitting: Family Medicine

## 2022-02-09 DIAGNOSIS — N138 Other obstructive and reflux uropathy: Secondary | ICD-10-CM

## 2022-02-09 DIAGNOSIS — K219 Gastro-esophageal reflux disease without esophagitis: Secondary | ICD-10-CM

## 2022-03-06 ENCOUNTER — Other Ambulatory Visit: Payer: Self-pay | Admitting: Family Medicine

## 2022-03-06 DIAGNOSIS — K219 Gastro-esophageal reflux disease without esophagitis: Secondary | ICD-10-CM

## 2022-03-06 DIAGNOSIS — N401 Enlarged prostate with lower urinary tract symptoms: Secondary | ICD-10-CM

## 2022-03-13 NOTE — Progress Notes (Unsigned)
HPI: Daniel Juarez is a 51 y.o. male, who is here today to follow on recent visit. He was last seen on 01/18/22 for urinary frequency and heartburn. Flomax 0.4 mg was started. UA and PSA in normal range. Lab Results  Component Value Date   PSA 0.64 01/18/2022   PSA 0.72 08/01/2021   PSA 0.48 11/19/2019   GERD: Started omeprazole 40 mg daily. ***  Review of Systems  Constitutional:  Negative for activity change, appetite change, fatigue, fever and unexpected weight change.  HENT:  Negative for nosebleeds, sore throat and trouble swallowing.   Eyes:  Negative for redness and visual disturbance.  Respiratory:  Negative for apnea, cough, shortness of breath and wheezing.   Cardiovascular:  Negative for chest pain, palpitations and leg swelling.  Gastrointestinal:  Negative for abdominal pain, nausea and vomiting.  Genitourinary:  Negative for decreased urine volume, dysuria and hematuria.  Neurological:  Negative for dizziness, seizures, weakness, numbness and headaches.  Psychiatric/Behavioral: Negative.     Rest see pertinent positives and negatives per HPI.  Current Outpatient Medications on File Prior to Visit  Medication Sig Dispense Refill   levothyroxine (SYNTHROID) 150 MCG tablet Take 1 tablet (150 mcg total) by mouth daily. 90 tablet 3   Multiple Vitamin (MULTIVITAMIN PO) Take by mouth daily.     Vitamin D, Ergocalciferol, (DRISDOL) 1.25 MG (50000 UNIT) CAPS capsule TAKE 1 CAPSULE BY MOUTH EVERY 3 WEEKS 4 capsule 3   No current facility-administered medications on file prior to visit.    Past Medical History:  Diagnosis Date   Allergy    Arthritis    Chicken pox    Hyperbilirubinemia    Rosacea, unspecified    Follows with dermatologists.   Thyroid disease    hypo   No Known Allergies  Social History   Socioeconomic History   Marital status: Married    Spouse name: Not on file   Number of children: Not on file   Years of education: Not on file    Highest education level: Master's degree (e.g., MA, MS, MEng, MEd, MSW, MBA)  Occupational History   Not on file  Tobacco Use   Smoking status: Never   Smokeless tobacco: Never  Vaping Use   Vaping Use: Never used  Substance and Sexual Activity   Alcohol use: Yes    Comment: occasionally   Drug use: No   Sexual activity: Yes    Partners: Female  Other Topics Concern   Not on file  Social History Narrative   Not on file   Social Determinants of Health   Financial Resource Strain: Low Risk  (07/31/2021)   Overall Financial Resource Strain (CARDIA)    Difficulty of Paying Living Expenses: Not hard at all  Food Insecurity: No Food Insecurity (07/31/2021)   Hunger Vital Sign    Worried About Running Out of Food in the Last Year: Never true    [place] in the Last Year: Never true  Transportation Needs: No Transportation Needs (07/31/2021)   PRAPARE - Hydrologist (Medical): No    Lack of Transportation (Non-Medical): No  Physical Activity: Unknown (07/31/2021)   Exercise Vital Sign    Days of Exercise per Week: Patient refused    Minutes of Exercise per Session: Not on file  Stress: Stress Concern Present (07/31/2021)   [place]    Feeling of Stress : To some extent  Social Connections: Moderately Integrated (07/31/2021)   Social Connection and Isolation Panel [NHANES]    Frequency of Communication with Friends and Family: Twice a week    Frequency of Social Gatherings with Friends and Family: More than three times a week    Attends Religious Services: More than 4 times per year    Active Member of [employer] or Organizations: No    Attends Archivist Meetings: Not on file    Marital Status: Married    Vitals:   03/15/22 1234  BP: 130/80  Pulse: 76  Resp: 12  SpO2: 97%   Body mass index is 32.7 kg/m.  Physical Exam Vitals and nursing note reviewed.   Constitutional:      General: He is not in acute distress.    Appearance: He is well-developed.  HENT:     Head: Normocephalic and atraumatic.  Eyes:     Conjunctiva/sclera: Conjunctivae normal.  Cardiovascular:     Rate and Rhythm: Normal rate and regular rhythm.     Heart sounds: No murmur heard. Pulmonary:     Effort: Pulmonary effort is normal. No respiratory distress.     Breath sounds: Normal breath sounds.  Abdominal:     Palpations: Abdomen is soft. There is no hepatomegaly or mass.     Tenderness: There is no abdominal tenderness.  Lymphadenopathy:     Cervical: No cervical adenopathy.  Skin:    General: Skin is warm.     Findings: No erythema or rash.  Neurological:     Mental Status: He is alert and oriented to person, place, and time.     Cranial Nerves: No cranial nerve deficit.     Gait: Gait normal.  Psychiatric:     Comments: Well groomed, good eye contact.     ASSESSMENT AND PLAN:   Daniel Juarez was seen today for follow-up.  Diagnoses and all orders for this visit:  Gastroesophageal reflux disease, unspecified whether esophagitis present -     pantoprazole (PROTONIX) 40 MG tablet; Take 1 tablet (40 mg total) by mouth daily.  BPH with obstruction/lower urinary tract symptoms -     tamsulosin (FLOMAX) 0.4 MG CAPS capsule; Take 1 capsule (0.4 mg total) by mouth daily.    No orders of the defined types were placed in this encounter.   BPH with obstruction/lower urinary tract symptoms Symptoms have greatly improved. Continue Flomax 0.4 mg daily at night. Some side effects discussed.  Gastroesophageal reflux disease Improved but still having some epigastric discomfort. Stop Omeprazole 40 mg and start Protonix 40 mg daily with empty stomach. GERD precautions also recommended. Instructed to let me know in about 8 weeks of symptoms are not resolved, in which case GI consultation will be arranged.   Return in about 5 months (around 08/15/2022), or if  symptoms worsen or fail to improve, for cpe.   Elfie Costanza G. Martin[place], MD  [facility]. [place] office.

## 2022-03-15 ENCOUNTER — Ambulatory Visit (INDEPENDENT_AMBULATORY_CARE_PROVIDER_SITE_OTHER): Payer: BC Managed Care – PPO | Admitting: Family Medicine

## 2022-03-15 ENCOUNTER — Encounter: Payer: Self-pay | Admitting: Family Medicine

## 2022-03-15 VITALS — BP 130/80 | HR 76 | Resp 12 | Ht 72.0 in | Wt 241.1 lb

## 2022-03-15 DIAGNOSIS — N138 Other obstructive and reflux uropathy: Secondary | ICD-10-CM

## 2022-03-15 DIAGNOSIS — N401 Enlarged prostate with lower urinary tract symptoms: Secondary | ICD-10-CM

## 2022-03-15 DIAGNOSIS — K219 Gastro-esophageal reflux disease without esophagitis: Secondary | ICD-10-CM | POA: Insufficient documentation

## 2022-03-15 MED ORDER — PANTOPRAZOLE SODIUM 40 MG PO TBEC: 40.0000 mg | DELAYED_RELEASE_TABLET | Freq: Every day | ORAL | 0 refills | Status: DC

## 2022-03-15 MED ORDER — TAMSULOSIN HCL 0.4 MG PO CAPS: 0.4000 mg | ORAL_CAPSULE | Freq: Every day | ORAL | 3 refills | Status: DC

## 2022-03-15 NOTE — Assessment & Plan Note (Signed)
Symptoms have greatly improved. Continue Flomax 0.4 mg daily at night. Some side effects discussed.

## 2022-03-15 NOTE — Assessment & Plan Note (Signed)
Improved but still having some epigastric discomfort. Stop Omeprazole 40 mg and start Protonix 40 mg daily with empty stomach. GERD precautions also recommended. Instructed to let me know in about 8 weeks of symptoms are not resolved, in which case GI consultation will be arranged.

## 2022-03-15 NOTE — Patient Instructions (Addendum)
A few things to remember from today's visit:  Gastroesophageal reflux disease, unspecified whether esophagitis present - Plan: pantoprazole (PROTONIX) 40 MG tablet  BPH with obstruction/lower urinary tract symptoms - Plan: tamsulosin (FLOMAX) 0.4 MG CAPS capsule  If you need refills please call your pharmacy. Do not use My Chart to request refills or for acute issues that need immediate attention.   In 8 weeks let me know if symptoms improved with new medication, if not gastro consultation will be necessary. No changes in Flomax.  Please be sure medication list is accurate. If a new problem present, please set up appointment sooner than planned today.

## 2022-04-06 ENCOUNTER — Other Ambulatory Visit: Payer: Self-pay | Admitting: Family Medicine

## 2022-04-06 DIAGNOSIS — K219 Gastro-esophageal reflux disease without esophagitis: Secondary | ICD-10-CM

## 2022-04-10 ENCOUNTER — Telehealth: Payer: Self-pay | Admitting: Family Medicine

## 2022-04-10 DIAGNOSIS — K219 Gastro-esophageal reflux disease without esophagitis: Secondary | ICD-10-CM

## 2022-04-10 NOTE — Telephone Encounter (Signed)
Pt called to follow up on last visit. MD asked Pt to call back if condition did not improve. Pt states condition is still troublesome, having stomach pain and irritation. Thinks it may be an ulcer.  Pt is asking if MD would like him to come in for another OV or will she give him a referral to the  GI doc?   Please advise. [phone]

## 2022-04-10 NOTE — Telephone Encounter (Signed)
Per PCP's OV note, referral can be placed to GI if patient not improved after 8 weeks. Referral has been placed.

## 2022-04-10 NOTE — Telephone Encounter (Signed)
I spoke with patient. He is aware that referral has been placed. He will let us know if he hasn't heard anything within a week & a half.

## 2022-06-02 DIAGNOSIS — R7309 Other abnormal glucose: Secondary | ICD-10-CM | POA: Diagnosis not present

## 2022-06-05 ENCOUNTER — Other Ambulatory Visit: Payer: Self-pay | Admitting: Family Medicine

## 2022-06-05 DIAGNOSIS — K219 Gastro-esophageal reflux disease without esophagitis: Secondary | ICD-10-CM

## 2022-06-13 NOTE — Progress Notes (Signed)
06/14/2022 Daniel Juarez [id number] 06-13-1971  Referring provider: Martinique, Betty G, MD Primary GI doctor: Dr. Havery Moros  ASSESSMENT AND PLAN:   Gastroesophageal reflux disease with some dysphagia/globulus sensation for a year, worsening, not responding to PPI Lifestyle changes discussed, avoid NSAIDS, ETOH Complaining of refractory GERD symptoms despite PPI therapy, dysphagia may be more from GERD as it is intermittent but worth scheduled EGD.  Will schedule EGD to evaluate for possible H. pylori, esophagitis, gastritis, peptic ulcer disease, etc.. I discussed risks of EGD with patient today, including risk of sedation, bleeding or perforation.  Patient provides understanding and gave verbal consent to proceed.  Will increase PPI to twice daily, emphasizing before food., did better with prilosec will switch to that Will get RUQ [place] with family history, check labs including lipase and consider HIDA.   History of adenomatous polyp of colon 4 mm TA, recall 7 years 10/2026   History of Present Illness:  51 y.o. male  with a past medical history of hypothyroidism, BPH, vitamin D deficiency and others listed below, returns to clinic today for evaluation of GERD.  10/04/2019 colonoscopy with Dr. Havery Moros for screening purposes good prep 4 mm polyp transverse colon, diverticulosis, internal hemorrhoids, precancerous recall 7 years.  10/2026  Patient has had gradual epigastric pain for 1 year. During yard work he would bend over a lot would get acid reflux/GERD.  After it became constant in May with epigastric pain with lump in May. Started on PPI for about 2 months, prilosec, was helping but then it came back worse while on the medication.  03/15/2022 returned office visit with primary care for reflux started on pantoprazole 40 mg daily. Which was not helping at all.  States any food or drink would cause inflammation.  Stopped all of his pills but thyroid, stopped MVIT, only has  been eating bland diet.  Pain is epigastric, some in mid back at worse.  Was taking pepto without help.  No nausea or vomiting.  No dysphagia but has had globulus sensation or feeling something is there, worse after taking pill or meats.  Never with exertion, goes to gym and lifts weights.  No SOB,  no diaphoresis.  No weight loss.  Brother with GB removed and mother with GB removed.  He does not smoke. Rare advil on weekend but has not had any in months.  Has not had CBC or kidney function since 07/2021.  No abdominal imaging.  He  reports that he has never smoked. He has never used smokeless tobacco. He reports current alcohol use. He reports that he does not use drugs. His family history includes Atrial fibrillation in his brother and mother; Cerebral palsy in his sister; Diabetes in his mother; Hypertension in his mother; Multiple sclerosis in his father.   Current Medications:   Current Outpatient Medications (Endocrine & Metabolic):    levothyroxine (SYNTHROID) 150 MCG tablet, Take 1 tablet (150 mcg total) by mouth daily.      Current Outpatient Medications (Other):    Multiple Vitamin (MULTIVITAMIN PO), Take by mouth daily.   omeprazole (PRILOSEC) 40 MG capsule, Take 1 capsule (40 mg total) by mouth 2 (two) times daily before a meal.   tamsulosin (FLOMAX) 0.4 MG CAPS capsule, Take 1 capsule (0.4 mg total) by mouth daily.   Vitamin D, Ergocalciferol, (DRISDOL) 1.25 MG (50000 UNIT) CAPS capsule, TAKE 1 CAPSULE BY MOUTH EVERY 3 WEEKS  Surgical History:  He  has a past surgical history that includes Tonsillectomy;  Vasectomy; and Anterior cervical decomp/discectomy fusion.  Current Medications, Allergies, Past Medical History, Past Surgical History, Family History and Social History were reviewed in [employer] record.  Physical Exam: BP 112/68   Pulse 76   Ht '5\' 11"'$  (1.803 m)   Wt 240 lb 4.8 oz (109 kg)   SpO2 96%   BMI 33.52 kg/m  General:    Pleasant, well developed male in no acute distress Heart : Regular rate and rhythm; no murmurs Pulm: Clear anteriorly; no wheezing Abdomen:  Soft, Non-distended AB, Active bowel sounds. mild tenderness in the epigastrium. Without guarding and Without rebound, No organomegaly appreciated. Rectal: Not evaluated Extremities:  without  edema. Neurologic:  Alert and  oriented x4;  No focal deficits.  Psych:  Cooperative. Normal mood and affect.   Vladimir Crofts, PA-C 06/14/22

## 2022-06-14 ENCOUNTER — Telehealth: Payer: BC Managed Care – PPO

## 2022-06-14 ENCOUNTER — Ambulatory Visit (INDEPENDENT_AMBULATORY_CARE_PROVIDER_SITE_OTHER): Payer: BC Managed Care – PPO | Admitting: Physician Assistant

## 2022-06-14 ENCOUNTER — Encounter: Payer: Self-pay | Admitting: Physician Assistant

## 2022-06-14 VITALS — BP 112/68 | HR 76 | Ht 71.0 in | Wt 240.3 lb

## 2022-06-14 DIAGNOSIS — K2100 Gastro-esophageal reflux disease with esophagitis, without bleeding: Secondary | ICD-10-CM | POA: Diagnosis not present

## 2022-06-14 DIAGNOSIS — Z86010 Personal history of colonic polyps: Secondary | ICD-10-CM

## 2022-06-14 DIAGNOSIS — R1319 Other dysphagia: Secondary | ICD-10-CM

## 2022-06-14 DIAGNOSIS — R1013 Epigastric pain: Secondary | ICD-10-CM

## 2022-06-14 LAB — COMPREHENSIVE METABOLIC PANEL
ALT: 31 U/L (ref 0–53)
AST: 24 U/L (ref 0–37)
Albumin: 4.5 g/dL (ref 3.5–5.2)
Alkaline Phosphatase: 61 U/L (ref 39–117)
BUN: 18 mg/dL (ref 6–23)
CO2: 30 mEq/L (ref 19–32)
Calcium: 10 mg/dL (ref 8.4–10.5)
Chloride: 102 mEq/L (ref 96–112)
Creatinine, Ser: 0.95 mg/dL (ref 0.40–1.50)
GFR: 91.59 mL/min (ref >60.00)
Glucose, Bld: 88 mg/dL (ref 70–99)
Potassium: 4.3 mEq/L (ref 3.5–5.1)
Sodium: 139 mEq/L (ref 135–145)
Total Bilirubin: 1.3 mg/dL — ABNORMAL HIGH (ref 0.2–1.2)
Total Protein: 7.8 g/dL (ref 6.0–8.3)

## 2022-06-14 LAB — LIPASE: Lipase: 50 U/L (ref 11.0–59.0)

## 2022-06-14 MED ORDER — OMEPRAZOLE 40 MG PO CPDR: 40.0000 mg | DELAYED_RELEASE_CAPSULE | Freq: Two times a day (BID) | ORAL | 1 refills | Status: DC

## 2022-06-14 NOTE — Patient Instructions (Addendum)
_______________________________________________________  If you are age 53 or older, your body mass index should be between 23-30. Your Body mass index is 33.52 kg/m. If this is out of the aforementioned range listed, please consider follow up with your Primary Care Provider.  If you are age 5 or younger, your body mass index should be between 19-25. Your Body mass index is 33.52 kg/m. If this is out of the aformentioned range listed, please consider follow up with your Primary Care Provider.   ________________________________________________________  The [facility] providers would like to encourage you to use [facility] to communicate with providers for non-urgent requests or questions.  Due to long hold times on the telephone, sending your provider a message by [facility] may be a faster and more efficient way to get a response.  Please allow 48 business hours for a response.  Please remember that this is for non-urgent requests.  _______________________________________________________  Daniel Juarez will be contacted by [facility] Scheduling in the next 2 days to arrange an ultrasound.  The number on your caller ID will be [phone], please answer when they call.  If you have not heard from them in 2 days please call [phone] to schedule.    Your provider has requested that you go to the basement level for lab work before leaving today. Press "B" on the elevator. The lab is located at the first door on the left as you exit the elevator.   Please take your proton pump inhibitor medication, can switch to prilosec and take up to twice a day  Please take this medication 30 minutes to 1 hour before meals- this makes it more effective.  Avoid spicy and acidic foods Avoid fatty foods Limit your intake of coffee, tea, alcohol, and carbonated drinks Work to maintain a healthy weight Keep the head of the bed elevated at least 3 inches with blocks or a wedge pillow if you are having any nighttime  symptoms Stay upright for 2 hours after eating Avoid meals and snacks three to four hours before bedtime

## 2022-06-17 LAB — CBC WITH DIFFERENTIAL/PLATELET
Basophils Absolute: 0.1 10*3/uL (ref 0.0–0.1)
Basophils Relative: 0.7 % (ref 0.0–3.0)
Eosinophils Absolute: 0.1 10*3/uL (ref 0.0–0.7)
Eosinophils Relative: 1.9 % (ref 0.0–5.0)
HCT: 44.3 % (ref 39.0–52.0)
Hemoglobin: 14.6 g/dL (ref 13.0–17.0)
Lymphocytes Relative: 33.6 % (ref 12.0–46.0)
Lymphs Abs: 2.3 10*3/uL (ref 0.7–4.0)
MCHC: 33 g/dL (ref 30.0–36.0)
MCV: 93.1 fl (ref 78.0–100.0)
Monocytes Absolute: 0.5 10*3/uL (ref 0.1–1.0)
Monocytes Relative: 7.6 % (ref 3.0–12.0)
Neutro Abs: 3.9 10*3/uL (ref 1.4–7.7)
Neutrophils Relative %: 55.6 % (ref 43.0–77.0)
Platelets: 243 10*3/uL (ref 150.0–400.0)
RBC: 4.76 Mil/uL (ref 4.22–5.81)
RDW: 13 % (ref 11.5–15.5)
WBC: 6.9 10*3/uL (ref 4.0–10.5)

## 2022-06-17 NOTE — Progress Notes (Signed)
Agree with assessment and plan as outlined.  

## 2022-06-19 ENCOUNTER — Encounter: Payer: BC Managed Care – PPO | Admitting: Gastroenterology

## 2022-06-28 ENCOUNTER — Other Ambulatory Visit: Payer: Self-pay | Admitting: Physician Assistant

## 2022-06-28 DIAGNOSIS — K2100 Gastro-esophageal reflux disease with esophagitis, without bleeding: Secondary | ICD-10-CM

## 2022-06-28 DIAGNOSIS — R1013 Epigastric pain: Secondary | ICD-10-CM

## 2022-07-01 ENCOUNTER — Ambulatory Visit (HOSPITAL_COMMUNITY): Payer: BC Managed Care – PPO

## 2022-07-03 DIAGNOSIS — R7309 Other abnormal glucose: Secondary | ICD-10-CM | POA: Diagnosis not present

## 2022-07-04 ENCOUNTER — Encounter: Payer: BC Managed Care – PPO | Admitting: Gastroenterology

## 2022-07-08 ENCOUNTER — Ambulatory Visit (HOSPITAL_COMMUNITY)
Admission: RE | Admit: 2022-07-08 | Discharge: 2022-07-08 | Disposition: A | Discharge status: Home / Self Care | Payer: BC Managed Care – PPO | Source: Ambulatory Visit | Attending: Physician Assistant | Admitting: Physician Assistant

## 2022-07-08 DIAGNOSIS — R109 Unspecified abdominal pain: Secondary | ICD-10-CM | POA: Diagnosis not present

## 2022-07-08 DIAGNOSIS — R1013 Epigastric pain: Secondary | ICD-10-CM | POA: Diagnosis not present

## 2022-07-08 DIAGNOSIS — K2100 Gastro-esophageal reflux disease with esophagitis, without bleeding: Secondary | ICD-10-CM | POA: Diagnosis not present

## 2022-07-30 ENCOUNTER — Encounter: Payer: Self-pay | Admitting: Gastroenterology

## 2022-07-30 ENCOUNTER — Ambulatory Visit (AMBULATORY_SURGERY_CENTER): Payer: BC Managed Care – PPO | Admitting: Gastroenterology

## 2022-07-30 VITALS — BP 115/78 | HR 65 | Temp 97.3°F | Resp 12 | Ht 71.0 in | Wt 240.0 lb

## 2022-07-30 DIAGNOSIS — K2100 Gastro-esophageal reflux disease with esophagitis, without bleeding: Secondary | ICD-10-CM | POA: Diagnosis not present

## 2022-07-30 MED ORDER — SODIUM CHLORIDE 0.9 % IV SOLN: 500.0000 mL | Freq: Once | INTRAVENOUS | Status: DC

## 2022-07-30 MED ORDER — OMEPRAZOLE 40 MG PO CPDR: 40.0000 mg | DELAYED_RELEASE_CAPSULE | Freq: Every day | ORAL | 3 refills | Status: DC

## 2022-07-30 NOTE — Patient Instructions (Signed)
YOU HAD AN ENDOSCOPIC PROCEDURE TODAY AT THE [facility]:   Refer to the procedure report that was given to you for any specific questions about what was found during the examination.  If the procedure report does not answer your questions, please call your gastroenterologist to clarify.  If you requested that your care partner not be given the details of your procedure findings, then the procedure report has been included in a sealed envelope for you to review at your convenience later.  YOU SHOULD EXPECT: Some feelings of bloating in the abdomen. Passage of more gas than usual.  Walking can help get rid of the air that was put into your GI tract during the procedure and reduce the bloating. If you had a lower endoscopy (such as a colonoscopy or flexible sigmoidoscopy) you may notice spotting of blood in your stool or on the toilet paper. If you underwent a bowel prep for your procedure, you may not have a normal bowel movement for a few days.  Please Note:  You might notice some irritation and congestion in your nose or some drainage.  This is from the oxygen used during your procedure.  There is no need for concern and it should clear up in a day or so.  SYMPTOMS TO REPORT IMMEDIATELY:   Following upper endoscopy (EGD)  Vomiting of blood or coffee ground material  New chest pain or pain under the shoulder blades  Painful or persistently difficult swallowing  New shortness of breath  Fever of 100F or higher  Black, tarry-looking stools  For urgent or emergent issues, a gastroenterologist can be reached at any hour by calling [phone]. Do not use MyChart messaging for urgent concerns.    DIET:  We do recommend a small meal at first, but then you may proceed to your regular diet.  Drink plenty of fluids but you should avoid alcoholic beverages for 24 hours.  ACTIVITY:  You should plan to take it easy for the rest of today and you should NOT DRIVE or use heavy machinery  until tomorrow (because of the sedation medicines used during the test).    FOLLOW UP: Our staff will call the number listed on your records the next business day following your procedure.  We will call around 7:15- 8:00 am to check on you and address any questions or concerns that you may have regarding the information given to you following your procedure. If we do not reach you, we will leave a message.     If any biopsies were taken you will be contacted by phone or by letter within the next 1-3 weeks.  Please call us at [phone] if you have not heard about the biopsies in 3 weeks.    SIGNATURES/CONFIDENTIALITY: You and/or your care partner have signed paperwork which will be entered into your electronic medical record.  These signatures attest to the fact that that the information above on your After Visit Summary has been reviewed and is understood.  Full responsibility of the confidentiality of this discharge information lies with you and/or your care-partner. 

## 2022-07-30 NOTE — Progress Notes (Signed)
Sedate, gd SR, tolerated procedure well, VSS, report to RN 

## 2022-07-30 NOTE — Progress Notes (Signed)
[facility] History and Physical   Primary Care Physician:  Martinique, Betty G, MD   Reason for Procedure:   GERD, globus, ? dysphagia  Plan:    EGD with possible dilation     HPI: Daniel Juarez is a 51 y.o. male  here for EGD to further evaluate GERD and globus, with ? Dysphagia. Initially PPI helped but then benefit wained. We had recommended he use twice daily omeprazole since the office visit and he has not tried it. He continues to have symptoms several days per week, currently not using any consistent PPI.  First time endoscopy.   Otherwise feels well without any cardiopulmonary symptoms.   I have discussed risks / benefits of anesthesia and endoscopic procedure with Riley Lam and they wish to proceed with the exams as outlined today.    Past Medical History:  Diagnosis Date   Allergy    Arthritis    Chicken pox    Hyperbilirubinemia    Rosacea, unspecified    Follows with dermatologists.   Thyroid disease    hypo    Past Surgical History:  Procedure Laterality Date   ANTERIOR CERVICAL DECOMP/DISCECTOMY FUSION     COLONOSCOPY     TONSILLECTOMY     VASECTOMY      Prior to Admission medications   Medication Sig Start Date End Date Taking? Authorizing Provider  levothyroxine (SYNTHROID) 150 MCG tablet Take 1 tablet (150 mcg total) by mouth daily. 09/28/21  Yes Shamleffer, Melanie Crazier, MD  Multiple Vitamin (MULTIVITAMIN PO) Take by mouth daily.   Yes [provider]  omeprazole (PRILOSEC) 40 MG capsule TAKE 1 CAPSULE (40 MG TOTAL) BY MOUTH 2 (TWO) TIMES DAILY BEFORE A MEAL. 06/28/22 08/27/22 Yes Vladimir Crofts, PA-C  tamsulosin (FLOMAX) 0.4 MG CAPS capsule Take 1 capsule (0.4 mg total) by mouth daily. 03/15/22  Yes Martinique, Betty G, MD  Vitamin D, Ergocalciferol, (DRISDOL) 1.25 MG (50000 UNIT) CAPS capsule TAKE 1 CAPSULE BY MOUTH EVERY 3 WEEKS 10/29/21  Yes Martinique, Betty G, MD    Current Outpatient Medications  Medication Sig  Dispense Refill   levothyroxine (SYNTHROID) 150 MCG tablet Take 1 tablet (150 mcg total) by mouth daily. 90 tablet 3   Multiple Vitamin (MULTIVITAMIN PO) Take by mouth daily.     omeprazole (PRILOSEC) 40 MG capsule TAKE 1 CAPSULE (40 MG TOTAL) BY MOUTH 2 (TWO) TIMES DAILY BEFORE A MEAL. 180 capsule 1   tamsulosin (FLOMAX) 0.4 MG CAPS capsule Take 1 capsule (0.4 mg total) by mouth daily. 90 capsule 3   Vitamin D, Ergocalciferol, (DRISDOL) 1.25 MG (50000 UNIT) CAPS capsule TAKE 1 CAPSULE BY MOUTH EVERY 3 WEEKS 4 capsule 3   Current Facility-Administered Medications  Medication Dose Route Frequency Provider Last Rate Last Admin   0.9 %  sodium chloride infusion  500 mL Intravenous Once Kasim Mccorkle, Carlota Raspberry, MD        Allergies as of 07/30/2022   (No Known Allergies)    Family History  Problem Relation Age of Onset   Diabetes Mother    Hypertension Mother    Atrial fibrillation Mother    Multiple sclerosis Father    Cerebral palsy Sister    Atrial fibrillation Brother    Colon cancer Neg Hx    Esophageal cancer Neg Hx    Rectal cancer Neg Hx    Stomach cancer Neg Hx     Social History   Socioeconomic History   Marital status: Married  Spouse name: Not on file   Number of children: Not on file   Years of education: Not on file   Highest education level: Master's degree (e.g., MA, MS, MEng, MEd, MSW, MBA)  Occupational History   Not on file  Tobacco Use   Smoking status: Never   Smokeless tobacco: Never  Vaping Use   Vaping Use: Never used  Substance and Sexual Activity   Alcohol use: Yes    Comment: occasionally   Drug use: No   Sexual activity: Yes    Partners: Female  Other Topics Concern   Not on file  Social History Narrative   Not on file   Social Determinants of Health   Financial Resource Strain: Low Risk  (07/31/2021)   Overall Financial Resource Strain (CARDIA)    Difficulty of Paying Living Expenses: Not hard at all  Food Insecurity: No Food  Insecurity (07/31/2021)   Hunger Vital Sign    Worried About Running Out of Food in the Last Year: Never true    [place] in the Last Year: Never true  Transportation Needs: No Transportation Needs (07/31/2021)   PRAPARE - Hydrologist (Medical): No    Lack of Transportation (Non-Medical): No  Physical Activity: Unknown (07/31/2021)   Exercise Vital Sign    Days of Exercise per Week: Patient refused    Minutes of Exercise per Session: Not on file  Stress: Stress Concern Present (07/31/2021)   [place]    Feeling of Stress : To some extent  Social Connections: Moderately Integrated (07/31/2021)   Social Connection and Isolation Panel [NHANES]    Frequency of Communication with Friends and Family: Twice a week    Frequency of Social Gatherings with Friends and Family: More than three times a week    Attends Religious Services: More than 4 times per year    Active Member of [employer] or Organizations: No    Attends Music therapist: Not on file    Marital Status: Married  Human resources officer Violence: Not on file    Review of Systems: All other review of systems negative except as mentioned in the HPI.  Physical Exam: Vital signs BP 112/71   Pulse 65   Temp (!) 97.3 F (36.3 C) (Temporal)   Ht '5\' 11"'$  (1.505 m)   Wt 240 lb (108.9 kg)   SpO2 99%   BMI 33.47 kg/m   General:   Alert,  Well-developed, pleasant and cooperative in NAD Lungs:  Clear throughout to auscultation.   Heart:  Regular rate and rhythm Abdomen:  Soft, nontender and nondistended.   Neuro/Psych:  Alert and cooperative. Normal mood and affect. A and O x 3  Jolly Mango, MD [facility]

## 2022-07-30 NOTE — Progress Notes (Signed)
VS completed by DT.  Pt's states no medical or surgical changes since previsit or office visit.  

## 2022-07-30 NOTE — Op Note (Signed)
[facility] Patient Name: Daniel Juarez Procedure Date: 07/30/2022 11:25 AM MRN: [id number] Endoscopist: Remo Lipps P. Havery Moros , MD, [id number] Age: 51 Referring MD:  Date of Birth: Jan 07, 1971 Gender: Male Account #: [id number] Procedure:                Upper GI endoscopy Indications:              Suspected gastro-esophageal reflux disease, Globus                            sensation - had improved on once daily PPI at some                            point but then response was lost. Has not yet tried                            twice daily dosing Medicines:                Monitored Anesthesia Care Procedure:                Pre-Anesthesia Assessment:                           - Prior to the procedure, a History and Physical                            was performed, and patient medications and                            allergies were reviewed. The patient's tolerance of                            previous anesthesia was also reviewed. The risks                            and benefits of the procedure and the sedation                            options and risks were discussed with the patient.                            All questions were answered, and informed consent                            was obtained. Prior Anticoagulants: The patient has                            taken no anticoagulant or antiplatelet agents. ASA                            Grade Assessment: II - A patient with mild systemic                            disease. After reviewing the risks and benefits,  the patient was deemed in satisfactory condition to                            undergo the procedure.                           After obtaining informed consent, the endoscope was                            passed under direct vision. Throughout the                            procedure, the patient's blood pressure, pulse, and                            oxygen saturations were  monitored continuously. The                            GIF [id number] #[id number] was introduced through the                            mouth, and advanced to the second part of duodenum.                            The upper GI endoscopy was accomplished without                            difficulty. The patient tolerated the procedure                            well. Scope In: Scope Out: Findings:                 Esophagogastric landmarks were identified: the                            Z-line was found at 43 cm, the gastroesophageal                            junction was found at 43 cm and the upper extent of                            the gastric folds was found at 43 cm from the                            incisors.                           The Z-line was slightly irregular but did not meet                            criteria for Barrett's esophagus.                           The exam of the esophagus was otherwise  normal. No                            overt inflammatory changes or stenosis / stricture                            appreciated.                           Biopsies were obtained from the proximal and distal                            esophagus with cold forceps for histology to rule                            out eosinophilic esophagitis.                           The entire examined stomach was normal.                           The examined duodenum was normal. Complications:            No immediate complications. Estimated blood loss:                            Minimal. Estimated Blood Loss:     Estimated blood loss was minimal. Impression:               - Esophagogastric landmarks identified.                           - Z-line slightly irregular but no evidence of                            Barrett's esophagus.                           - Normal esophagus otherwise - biopsies taken to                            rule out eosinophilic esophagitis                           -  Normal stomach.                           - Normal examined duodenum. Recommendation:           - Patient has a contact number available for                            emergencies. The signs and symptoms of potential                            delayed complications were discussed with the  patient. Return to normal activities tomorrow.                            Written discharge instructions were provided to the                            patient.                           - Resume previous diet.                           - Continue present medications.                           - Trial of omeprazole '40mg'$  twice daily for a few                            weeks to see if this improves symptoms. If not, and                            symptoms persist, may need to consider motility /                            pH testing depending on how much symptoms bother                            the patient                           - Await pathology results. Remo Lipps P. Shanell Aden, MD 07/30/2022 11:54:14 AM This report has been signed electronically.

## 2022-07-30 NOTE — Progress Notes (Signed)
Called to room to assist during endoscopic procedure.  Patient ID and intended procedure confirmed with present staff. Received instructions for my participation in the procedure from the performing physician.  

## 2022-07-31 ENCOUNTER — Telehealth: Payer: Self-pay | Admitting: *Deleted

## 2022-07-31 NOTE — Telephone Encounter (Signed)
Post procedure follow up phone call. No answer at number given.  Left message on voicemail.  

## 2022-08-02 DIAGNOSIS — R7309 Other abnormal glucose: Secondary | ICD-10-CM | POA: Diagnosis not present

## 2022-08-19 ENCOUNTER — Ambulatory Visit (INDEPENDENT_AMBULATORY_CARE_PROVIDER_SITE_OTHER): Payer: BC Managed Care – PPO | Admitting: Internal Medicine

## 2022-08-19 ENCOUNTER — Encounter: Payer: Self-pay | Admitting: Internal Medicine

## 2022-08-19 VITALS — BP 124/76 | HR 70 | Ht 71.0 in | Wt 245.0 lb

## 2022-08-19 DIAGNOSIS — R7303 Prediabetes: Secondary | ICD-10-CM

## 2022-08-19 DIAGNOSIS — E559 Vitamin D deficiency, unspecified: Secondary | ICD-10-CM | POA: Diagnosis not present

## 2022-08-19 DIAGNOSIS — E039 Hypothyroidism, unspecified: Secondary | ICD-10-CM | POA: Diagnosis not present

## 2022-08-19 LAB — VITAMIN D 25 HYDROXY (VIT D DEFICIENCY, FRACTURES): VITD: 41.45 ng/mL (ref 30.00–100.00)

## 2022-08-19 LAB — HEMOGLOBIN A1C: Hgb A1c MFr Bld: 5.9 % (ref 4.6–6.5)

## 2022-08-19 LAB — TSH: TSH: 1.87 u[IU]/mL (ref 0.35–5.50)

## 2022-08-19 NOTE — Progress Notes (Unsigned)
Name: Daniel Juarez  MRN/ DOB: [id number], 1971-01-09    Age/ Sex: 51 y.o., male     PCP: [place]ique, Betty G, MD   Reason for Endocrinology Evaluation: Hypothyroidism     Initial Endocrinology Clinic Visit: 08/09/2020    PATIENT IDENTIFIER: Daniel Juarez is a 51 y.o., male with a past medical history of hypothyroidism, environmental allergies and Vitamin D deficiency. He has followed with [facility] since 08/09/2020 for consultative assistance with management of his hypothyroidism.   HISTORICAL SUMMARY:   He has been diagnosed with hypothyroidism in 2008. Baseline thyroid levels not available. He has been on LT-4 replacement since then.      In 2012 he was diagnosed with Vitamin B12 and D deficiency   In  Review of his records he was seen by Dr. Elyse Hsu in 2018, per notes he was evaluated in 2014 for cognitive symptoms and fatigue . There was some suspicion of depression and multiple situation stresses at the time,  as all his labs were normal at the time.     He was having issues with anxiety, confusion, loose stools that he attributed to low normal TSH level, we reduced his levothyroxine from 175 to 150 mcg daily   He had concerns about feet stress fractures, but his Alk. Phos, Calcium, vitamin D , PTH , Mg and phos as well as DXA scan were all normal.     SUBJECTIVE:     Today (08/19/2022):  Daniel Juarez is here for a follow up on hypothyroidism   He is S/P EGD due to dysphagia, GERD 07/22/2022, he continues with esophageal irritation with capsule intake   His weight continues to fluctuate  Has noted constipation , has been fiber  Denies local neck swelling  Has occasional  palpitations and anxiety  Having sluggish sensation and memory issues as well as irritability Had COVID infection earlier in the fall     Levothyroxine 150 mcg daily  Vitamin D 50,000 EVERY 3 weeks  Vitamin D 1000 iu daily     HISTORY:  Past Medical History:   Past Medical History:  Diagnosis Date   Allergy    Arthritis    Chicken pox    Hyperbilirubinemia    Rosacea, unspecified    Follows with dermatologists.   Thyroid disease    hypo   Past Surgical History:  Past Surgical History:  Procedure Laterality Date   ANTERIOR CERVICAL DECOMP/DISCECTOMY FUSION     COLONOSCOPY     TONSILLECTOMY     VASECTOMY     Social History:  reports that he has never smoked. He has never used smokeless tobacco. He reports current alcohol use. He reports that he does not use drugs. Family History:  Family History  Problem Relation Age of Onset   Diabetes Mother    Hypertension Mother    Atrial fibrillation Mother    Multiple sclerosis Father    Cerebral palsy Sister    Atrial fibrillation Brother    Colon cancer Neg Hx    Esophageal cancer Neg Hx    Rectal cancer Neg Hx    Stomach cancer Neg Hx      HOME MEDICATIONS: Allergies as of 08/19/2022   No Known Allergies      Medication List        Accurate as of August 19, 2022  1:39 PM. If you have any questions, ask your nurse or doctor.          STOP taking  these medications    omeprazole 40 MG capsule Commonly known as: PRILOSEC Stopped by: Dorita Sciara, MD       TAKE these medications    levothyroxine 150 MCG tablet Commonly known as: SYNTHROID Take 1 tablet (150 mcg total) by mouth daily.   MULTIVITAMIN PO Take by mouth daily.   tamsulosin 0.4 MG Caps capsule Commonly known as: FLOMAX Take 1 capsule (0.4 mg total) by mouth daily.   Vitamin D (Ergocalciferol) 1.25 MG (50000 UNIT) Caps capsule Commonly known as: DRISDOL TAKE 1 CAPSULE BY MOUTH EVERY 3 WEEKS          OBJECTIVE:   PHYSICAL EXAM: VS: BP 124/76 (BP Location: Left Arm, Patient Position: Sitting, Cuff Size: Large)   Pulse 70   Ht _0  (1.803 m)   Wt 245 lb (111.1 kg)   SpO2 96%   BMI 34.17 kg/m    EXAM: General: Pt appears well and is in NAD  Neck: General: Supple without  adenopathy. Thyroid: Thyroid size normal.  No goiter or nodules appreciated.  Lungs: Clear with good BS bilat with no rales, rhonchi, or wheezes  Heart: Auscultation: RRR.  Abdomen: Normoactive bowel sounds, soft, nontender, without masses or organomegaly palpable  Extremities:  BL LE: No pretibial edema   Mental Status: Judgment, insight: Intact Mood and affect: No depression, anxiety, or agitation     DATA REVIEWED:  ****   Latest Reference Range & Units 06/14/22 14:51  Sodium 135 - 145 mEq/L 139  Potassium 3.5 - 5.1 mEq/L 4.3  Chloride 96 - 112 mEq/L 102  CO2 19 - 32 mEq/L 30  Glucose 70 - 99 mg/dL 88  BUN 6 - 23 mg/dL 18  Creatinine 0.40 - 1.50 mg/dL 0.95  Calcium 8.4 - 10.5 mg/dL 10.0  Alkaline Phosphatase 39 - 117 U/L 61  Albumin 3.5 - 5.2 g/dL 4.5  Lipase 11.0 - 59.0 U/L 50.0  AST 0 - 37 U/L 24  ALT 0 - 53 U/L 31  Total Protein 6.0 - 8.3 g/dL 7.8  Total Bilirubin 0.2 - 1.2 mg/dL 1.3 (H)  GFR >60.00 mL/min 91.59    ASSESSMENT / PLAN / RECOMMENDATIONS:   1. Hypothyroidism:   - Clinically he is feeling better  - No local neck symptoms  - He has been taking Levothyroxine appropriately  - TSH is normal   Medications   Continue Levothyroxine 150 mcg daily   2. Vitamin D Deficiency :  - He is on Vitamin D3 5000 iu every other day  - Vitamin D normal    3. Pre-diabetes   - A1c 5.9 % , we discussed low carb diet with exercise 150-175 minutes per week     F/U in 4 months    Signed electronically by: Mack Guise, MD  [facility]  [facility] [address] [place], [place] [address] Phone: [phone] FAX: [phone]      CC: [place], Betty G, [place] [address] Phone: [phone]  Fax: [phone]   Return to Endocrinology clinic as below: No future appointments.

## 2022-08-21 MED ORDER — LEVOTHYROXINE SODIUM 150 MCG PO TABS: 150.0000 ug | ORAL_TABLET | Freq: Every day | ORAL | 3 refills | Status: DC

## 2022-09-02 DIAGNOSIS — R7309 Other abnormal glucose: Secondary | ICD-10-CM | POA: Diagnosis not present

## 2022-09-03 ENCOUNTER — Telehealth: Payer: Self-pay

## 2022-09-03 NOTE — Telephone Encounter (Signed)
Patient called into the office and wanted to let provider know that tamsulosin (FLOMAX) 0.4 MG CAPS capsule  has been upsetting his esophagus and upper stomach and he would like to know if you would recommend anything else he said it may be that it is in capsul form but he is not sure.   He can be reached at [phone]

## 2022-09-03 NOTE — Telephone Encounter (Signed)
Please advise, he has an appointment on Friday.

## 2022-09-04 NOTE — Progress Notes (Signed)
ACUTE VISIT Chief Complaint  Patient presents with   Medication Management   HPI: [name] is a 52 y.o. male with past medical history significant for GERD, BPH, vitamin D deficiency, and hypothyroidism here today complaining of epigastric abdominal pain, which he thinks it is being aggravated by Flomax. Flomax 0.4 mg daily was added in 03/2022 to help with urinary symptoms. He has tried taking Flomax at different times of the day, like after lunch, but it still causes pain and he does cause sleepiness. He has stopped taking Flomax due to the pain it caused and is seeking an alternative medication. Denies dysuria,gross hematuria,or decreased urine output. Since he discontinued Flomax he has noted decrease in urine stream and urinary frequency. Lab Results  Component Value Date   PSA 0.64 01/18/2022   PSA 0.72 08/01/2021   PSA 0.48 11/19/2019   He has been experiencing pain in his lower chest and epigastric pain, particularly when taking medications in capsule form. He has taken Pepto-Bismol for inflammation, which provides temporary relief.  Occasionally associated with acid reflux. He has had an endoscopy and RUQ ultrasound performed by Dr. Havery Moros, on 07/30/22  and 07/08/22 respectively ; negative for significant abnormalities.  According to patient, he was advised to take Omeprazole twice a day, but the capsules cause pain. He has switched to Pantoprazole tablets, which are less irritating.  He has also noticed that the pain can be triggered by spicy foods and yesterday he had an episode while he was exercising, treadmill and lifting weights.  States that he was already having pain when he started with exercise.  He denies associated palpitation, dyspnea, or diaphoresis and yesterday  it lasted for 15 min.  Pain is not radiated and also happens when he is at rest. Negative for nausea, vomiting, changes in bowel habits, melena, blood in the stool. He has not noted  abnormal weight loss, fever, or night sweats.  He had a colonoscopy in 10/2019.  Review of Systems  Constitutional:  Negative for activity change, appetite change and chills.  HENT:  Negative for sore throat and trouble swallowing.   Respiratory:  Negative for cough and wheezing.   Cardiovascular:  Negative for leg swelling.  Endocrine: Negative for cold intolerance and heat intolerance.  Neurological:  Negative for syncope, weakness and headaches.  See other pertinent positives and negatives in HPI.  Current Outpatient Medications on File Prior to Visit  Medication Sig Dispense Refill   levothyroxine (SYNTHROID) 150 MCG tablet Take 1 tablet (150 mcg total) by mouth daily. 90 tablet 3   Multiple Vitamin (MULTIVITAMIN PO) Take by mouth daily.     Vitamin D, Ergocalciferol, (DRISDOL) 1.25 MG (50000 UNIT) CAPS capsule TAKE 1 CAPSULE BY MOUTH EVERY 3 WEEKS 4 capsule 3   No current facility-administered medications on file prior to visit.   Past Medical History:  Diagnosis Date   Allergy    Arthritis    Chicken pox    Hyperbilirubinemia    Rosacea, unspecified    Follows with dermatologists.   Thyroid disease    hypo   No Known Allergies  Social History   Socioeconomic History   Marital status: Married    Spouse name: Not on file   Number of children: Not on file   Years of education: Not on file   Highest education level: Master's degree (e.g., MA, MS, MEng, MEd, MSW, MBA)  Occupational History   Not on file  Tobacco Use   Smoking status: Never  Smokeless tobacco: Never  Vaping Use   Vaping Use: Never used  Substance and Sexual Activity   Alcohol use: Yes    Comment: occasionally   Drug use: No   Sexual activity: Yes    Partners: Female  Other Topics Concern   Not on file  Social History Narrative   Not on file   Social Determinants of Health   Financial Resource Strain: Low Risk  (09/06/2022)   Overall Financial Resource Strain (CARDIA)    Difficulty of  Paying Living Expenses: Not hard at all  Food Insecurity: No Food Insecurity (09/06/2022)   Hunger Vital Sign    Worried About Running Out of Food in the Last Year: Never true    Ran Out of Food in the Last Year: Never true  Transportation Needs: No Transportation Needs (09/06/2022)   PRAPARE - Hydrologist (Medical): No    Lack of Transportation (Non-Medical): No  Physical Activity: Insufficiently Active (09/06/2022)   Exercise Vital Sign    Days of Exercise per Week: 2 days    Minutes of Exercise per Session: 50 min  Stress: No Stress Concern Present (09/06/2022)   [place]    Feeling of Stress : Only a little  Social Connections: Moderately Integrated (09/06/2022)   Social Connection and Isolation Panel [NHANES]    Frequency of Communication with Friends and Family: Once a week    Frequency of Social Gatherings with Friends and Family: More than three times a week    Attends Religious Services: More than 4 times per year    Active Member of [employer] or Organizations: No    Attends Archivist Meetings: Not on file    Marital Status: Married    Vitals:   09/06/22 1220  BP: 118/80  Pulse: 66  Resp: 12  Temp: 98.5 F (36.9 C)  SpO2: 99%   Body mass index is 34.59 kg/m.  Physical Exam Vitals and nursing note reviewed.  Constitutional:      General: He is not in acute distress.    Appearance: He is well-developed.  HENT:     Head: Normocephalic and atraumatic.     Mouth/Throat:     Mouth: Mucous membranes are moist.     Pharynx: Oropharynx is clear.  Eyes:     Conjunctiva/sclera: Conjunctivae normal.  Cardiovascular:     Rate and Rhythm: Normal rate and regular rhythm.     Heart sounds: No murmur heard. Pulmonary:     Effort: Pulmonary effort is normal. No respiratory distress.     Breath sounds: Normal breath sounds.  Abdominal:     Palpations: Abdomen is soft. There is  no mass.     Tenderness: There is no abdominal tenderness.  Skin:    General: Skin is warm.     Findings: No erythema.  Neurological:     Mental Status: He is alert and oriented to person, place, and time.   ASSESSMENT AND PLAN:  [name] was seen today for medication management.  Diagnoses and all orders for this visit: Epigastric abdominal pain Assessment & Plan: This is a chronic problem. We reviewed possible causes, history suggest GI etiology. Because it was aggravated yesterday with exercise, EKG was done today. EKG with NSR, normal axis and intervals. T wave changes just in DIII and early repolarization.  Otherwise negative for ischemic changes. No other EKG available for comparison. Instructed about warning signs.  Orders: -  EKG 12-Lead  Gastroesophageal reflux disease with esophagitis without hemorrhage Assessment & Plan: Problem has improved some, no longer having heartburn but is still having epigastric pain. Continue pantoprazole 40 mg 30 minutes before breakfast. Pepcid 40 mg added to be taken at bedtime. Continue GERD precautions. Continue following with GI as instructed.  Orders: -     Famotidine; Take 1 tablet (40 mg total) by mouth at bedtime.  Dispense: 30 tablet; Refill: 2 -     Pantoprazole Sodium; Take 1 tablet (40 mg total) by mouth daily.  Dispense: 30 tablet; Refill: 3  BPH with obstruction/lower urinary tract symptoms Assessment & Plan: Symptoms were well-controlled with Flomax but discontinued because medication was aggravating epigastric pain.  Most of alpha-1 adrenergic receptors antagonist come in capsules, which seems to aggravate GERD symptoms. He agrees with trying doxazosin 1 mg daily at night, we discussed some side effects. If symptoms are not adequately controlled with new medication, we will consider referral to urologist.  Orders: -     Doxazosin Mesylate; Take 1 tablet (1 mg total) by mouth at bedtime.  Dispense: 90 tablet;  Refill: 0  Return if symptoms worsen or fail to improve, for keep next appointment.  Tmya Wigington G. Martinique, MD  [facility]. [place] office.

## 2022-09-06 ENCOUNTER — Ambulatory Visit (INDEPENDENT_AMBULATORY_CARE_PROVIDER_SITE_OTHER): Payer: BC Managed Care – PPO | Admitting: Family Medicine

## 2022-09-06 VITALS — BP 118/80 | HR 66 | Temp 98.5°F | Resp 12 | Ht 71.0 in | Wt 248.0 lb

## 2022-09-06 DIAGNOSIS — N401 Enlarged prostate with lower urinary tract symptoms: Secondary | ICD-10-CM

## 2022-09-06 DIAGNOSIS — N138 Other obstructive and reflux uropathy: Secondary | ICD-10-CM

## 2022-09-06 DIAGNOSIS — K2100 Gastro-esophageal reflux disease with esophagitis, without bleeding: Secondary | ICD-10-CM

## 2022-09-06 DIAGNOSIS — R1013 Epigastric pain: Secondary | ICD-10-CM

## 2022-09-06 MED ORDER — DOXAZOSIN MESYLATE 1 MG PO TABS: 1.0000 mg | ORAL_TABLET | Freq: Every day | ORAL | 0 refills | Status: DC

## 2022-09-06 MED ORDER — PANTOPRAZOLE SODIUM 40 MG PO TBEC: 40.0000 mg | DELAYED_RELEASE_TABLET | Freq: Every day | ORAL | 3 refills | Status: DC

## 2022-09-06 MED ORDER — FAMOTIDINE 40 MG PO TABS: 40.0000 mg | ORAL_TABLET | Freq: Every day | ORAL | 2 refills | Status: DC

## 2022-09-06 NOTE — Assessment & Plan Note (Addendum)
This is a chronic problem. We reviewed possible causes, history suggest GI etiology. Because it was aggravated yesterday with exercise, EKG was done today. EKG with NSR, normal axis and intervals. T wave changes just in DIII and early repolarization.  Otherwise negative for ischemic changes. No other EKG available for comparison. Instructed about warning signs.

## 2022-09-06 NOTE — Assessment & Plan Note (Signed)
Problem has improved some, no longer having heartburn but is still having epigastric pain. Continue pantoprazole 40 mg 30 minutes before breakfast. Pepcid 40 mg added to be taken at bedtime. Continue GERD precautions. Continue following with GI as instructed.

## 2022-09-06 NOTE — Patient Instructions (Addendum)
A few things to remember from today's visit:  Epigastric abdominal pain - Plan: EKG 12-Lead  Gastroesophageal reflux disease with esophagitis without hemorrhage - Plan: famotidine (PEPCID) 40 MG tablet, pantoprazole (PROTONIX) 40 MG tablet  BPH with obstruction/lower urinary tract symptoms - Plan: doxazosin (CARDURA) 1 MG tablet  Try Pepcid at bedtime and Protonix tab 30 min before breakfast. Continue avoiding foods that can aggravate pain.  Try Doxazosin at night for urinary symptoms.  If you need refills for medications you take chronically, please call your pharmacy. Do not use My Chart to request refills or for acute issues that need immediate attention. If you send a my chart message, it may take a few days to be addressed, specially if I am not in the office.  Please be sure medication list is accurate. If a new problem present, please set up appointment sooner than planned today.

## 2022-09-06 NOTE — Assessment & Plan Note (Signed)
Symptoms were well-controlled with Flomax but discontinued because medication was aggravating epigastric pain.  Most of alpha-1 adrenergic receptors antagonist come in capsules, which seems to aggravate GERD symptoms. He agrees with trying doxazosin 1 mg daily at night, we discussed some side effects. If symptoms are not adequately controlled with new medication, we will consider referral to urologist.

## 2022-09-14 ENCOUNTER — Other Ambulatory Visit: Payer: Self-pay | Admitting: Family Medicine

## 2022-09-14 DIAGNOSIS — K2100 Gastro-esophageal reflux disease with esophagitis, without bleeding: Secondary | ICD-10-CM

## 2022-10-02 ENCOUNTER — Ambulatory Visit: Payer: BC Managed Care – PPO | Admitting: Internal Medicine

## 2022-10-03 DIAGNOSIS — R7309 Other abnormal glucose: Secondary | ICD-10-CM | POA: Diagnosis not present

## 2022-10-20 ENCOUNTER — Other Ambulatory Visit: Payer: Self-pay | Admitting: Internal Medicine

## 2022-10-20 DIAGNOSIS — E559 Vitamin D deficiency, unspecified: Secondary | ICD-10-CM

## 2022-11-01 DIAGNOSIS — R7309 Other abnormal glucose: Secondary | ICD-10-CM | POA: Diagnosis not present

## 2022-11-07 DIAGNOSIS — L718 Other rosacea: Secondary | ICD-10-CM | POA: Diagnosis not present

## 2022-11-13 ENCOUNTER — Ambulatory Visit (INDEPENDENT_AMBULATORY_CARE_PROVIDER_SITE_OTHER): Payer: BC Managed Care – PPO | Admitting: Family Medicine

## 2022-11-13 ENCOUNTER — Encounter: Payer: Self-pay | Admitting: Family Medicine

## 2022-11-13 VITALS — BP 128/80 | HR 75 | Resp 12 | Ht 71.0 in | Wt 247.5 lb

## 2022-11-13 DIAGNOSIS — R350 Frequency of micturition: Secondary | ICD-10-CM | POA: Diagnosis not present

## 2022-11-13 DIAGNOSIS — M5441 Lumbago with sciatica, right side: Secondary | ICD-10-CM | POA: Diagnosis not present

## 2022-11-13 DIAGNOSIS — K2100 Gastro-esophageal reflux disease with esophagitis, without bleeding: Secondary | ICD-10-CM

## 2022-11-13 DIAGNOSIS — K5900 Constipation, unspecified: Secondary | ICD-10-CM | POA: Diagnosis not present

## 2022-11-13 DIAGNOSIS — N401 Enlarged prostate with lower urinary tract symptoms: Secondary | ICD-10-CM | POA: Diagnosis not present

## 2022-11-13 LAB — POC URINALSYSI DIPSTICK (AUTOMATED)
Bilirubin, UA: NEGATIVE
Blood, UA: NEGATIVE
Glucose, UA: NEGATIVE
Ketones, UA: NEGATIVE
Leukocytes, UA: NEGATIVE
Nitrite, UA: NEGATIVE
Protein, UA: NEGATIVE
Urobilinogen, UA: 0.2 E.U./dL
pH, UA: 6.5 (ref 5.0–8.0)

## 2022-11-13 MED ORDER — LINACLOTIDE 145 MCG PO CAPS: 145.0000 ug | ORAL_CAPSULE | Freq: Every day | ORAL | 1 refills | Status: DC

## 2022-11-13 MED ORDER — PREDNISONE 20 MG PO TABS: ORAL_TABLET | ORAL | 0 refills | Status: AC

## 2022-11-13 NOTE — Assessment & Plan Note (Signed)
Problem has improved but not well controlled. Other options like Dexilant discussed, he would like to continue Omeprazole 40 mg bid. Continue GERD precautions. Follows with GI.

## 2022-11-13 NOTE — Progress Notes (Unsigned)
ACUTE VISIT Chief Complaint  Patient presents with   Urinary Frequency   HPI: [name] is a 52 y.o. male, who is here today complaining of *** HPI Last visit ,09/06/22,he was complaining of epigastric abdominal pain, which he thought it was aggravated by Flomax. Flomax 0.4 mg daily was added in 03/2022 to help with urinary symptoms, it was changed to Doxazosin. Lab Results  Component Value Date   PSA 0.64 01/18/2022   PSA 0.72 08/01/2021   PSA 0.48 11/19/2019  He has been taking Pantoprazole twice a day as suggested by Dr. Havery Juarez and also tried Omeprazole, which he found to be fine. He continues to experience occasional acid reflux and a sensation of something in his throat, but no difficulty swallowing. The patient has noticed that these symptoms are more prominent after active weekends or certain activities like going to the gym or doing yard work. He has been managing his diet and found that eating a banana and yogurt with flaxseed in the morning helps.  Recently, the patient has been experiencing an urgency to urinate and started taking Flomax again, but it has not yet provided relief. He reports no rectal pain, pain with urination, fever, or chills. The patient's prostate was checked in May of the previous year, and the urine test was normal. He is due for a PSA test to rule out prostatitis.  For the past six weeks, the patient has been experiencing pain in his lower back and buttocks on the right side, which he believes may be due to leg press exercises. He reports that the pain is worse when lying down and that acetaminophen helps alleviate it. Additionally, he has experienced tingling in his right foot for the past two weeks, but no weakness in the leg.  The patient also reports constipation, which he believes may be related to his Omeprazole and other stomach medications. He has bowel movements every other day, which can be hard and difficult to pass. He has tried  increasing his fiber intake but has not yet tried MiraLax or Dulcolax. Review of Systems See other pertinent positives and negatives in HPI.  Current Outpatient Medications on File Prior to Visit  Medication Sig Dispense Refill   doxazosin (CARDURA) 1 MG tablet Take 1 tablet (1 mg total) by mouth at bedtime. 90 tablet 0   famotidine (PEPCID) 40 MG tablet TAKE 1 TABLET BY MOUTH EVERYDAY AT BEDTIME 90 tablet 1   levothyroxine (SYNTHROID) 150 MCG tablet Take 1 tablet (150 mcg total) by mouth daily. 90 tablet 3   Multiple Vitamin (MULTIVITAMIN PO) Take by mouth daily.     pantoprazole (PROTONIX) 40 MG tablet TAKE 1 TABLET BY MOUTH EVERY DAY 90 tablet 1   Vitamin D, Ergocalciferol, (DRISDOL) 1.25 MG (50000 UNIT) CAPS capsule TAKE 1 CAPSULE BY MOUTH ONCE EVERY 3 WEEKS 4 capsule 3   No current facility-administered medications on file prior to visit.    Past Medical History:  Diagnosis Date   Allergy    Arthritis    Chicken pox    Hyperbilirubinemia    Rosacea, unspecified    Follows with dermatologists.   Thyroid disease    hypo   No Known Allergies  Social History   Socioeconomic History   Marital status: Married    Spouse name: Not on file   Number of children: Not on file   Years of education: Not on file   Highest education level: Master's degree (e.g., MA, MS, MEng, MEd, MSW,  MBA)  Occupational History   Not on file  Tobacco Use   Smoking status: Never   Smokeless tobacco: Never  Vaping Use   Vaping Use: Never used  Substance and Sexual Activity   Alcohol use: Yes    Comment: occasionally   Drug use: No   Sexual activity: Yes    Partners: Female  Other Topics Concern   Not on file  Social History Narrative   Not on file   Social Determinants of Health   Financial Resource Strain: Low Risk  (09/06/2022)   Overall Financial Resource Strain (CARDIA)    Difficulty of Paying Living Expenses: Not hard at all  Food Insecurity: No Food Insecurity (09/06/2022)   Hunger  Vital Sign    Worried About Running Out of Food in the Last Year: Never true    Ran Out of Food in the Last Year: Never true  Transportation Needs: No Transportation Needs (09/06/2022)   PRAPARE - Hydrologist (Medical): No    Lack of Transportation (Non-Medical): No  Physical Activity: Insufficiently Active (09/06/2022)   Exercise Vital Sign    Days of Exercise per Week: 2 days    Minutes of Exercise per Session: 50 min  Stress: No Stress Concern Present (09/06/2022)   [place]    Feeling of Stress : Only a little  Social Connections: Moderately Integrated (09/06/2022)   Social Connection and Isolation Panel [NHANES]    Frequency of Communication with Friends and Family: Once a week    Frequency of Social Gatherings with Friends and Family: More than three times a week    Attends Religious Services: More than 4 times per year    Active Member of [employer] or Organizations: No    Attends Archivist Meetings: Not on file    Marital Status: Married    Vitals:   11/13/22 1526  BP: 128/80  Pulse: 75  Resp: 12  SpO2: 97%   Body mass index is 34.52 kg/m.  Physical Exam Nursing note reviewed.  Constitutional:      General: He is not in acute distress.    Appearance: He is well-developed.  HENT:     Head: Normocephalic and atraumatic.  Eyes:     Conjunctiva/sclera: Conjunctivae normal.  Cardiovascular:     Rate and Rhythm: Normal rate and regular rhythm.     Pulses:          Dorsalis pedis pulses are 2+ on the right side and 2+ on the left side.     Heart sounds: No murmur heard. Pulmonary:     Effort: Pulmonary effort is normal. No respiratory distress.     Breath sounds: Normal breath sounds.  Abdominal:     Palpations: Abdomen is soft. There is no hepatomegaly or mass.     Tenderness: There is no abdominal tenderness.  Lymphadenopathy:     Cervical: No cervical adenopathy.   Skin:    General: Skin is warm.     Findings: No erythema or rash.  Neurological:     Mental Status: He is alert and oriented to person, place, and time.     Cranial Nerves: No cranial nerve deficit.     Gait: Gait normal.  Psychiatric:     Comments: Well groomed, good eye contact.     ASSESSMENT AND PLAN: Urinary frequency -     POCT Urinalysis Dipstick (Automated)    No follow-ups on file.  Daniel Juarez G. [place], MD  [facility]. [place] office.  Discharge Instructions   None

## 2022-11-13 NOTE — Assessment & Plan Note (Signed)
Problem has been going on for 6 weeks and now having right toes tingling. Recommend prednisone taper, side effects discussed, recommended taking it with breakfast. I do not think imaging is necessary at this time but lumbar MRI needs to be considered if problem doe snot resolved in a few weeks. Instructed about warning signs.

## 2022-11-13 NOTE — Assessment & Plan Note (Addendum)
Gradually getting worse. Symptoms have not improved with Flomax 0.4 mg as it did before but he just started a couple days ago, so continue taking it for a few more weeks. Urology referral placed.

## 2022-11-13 NOTE — Patient Instructions (Addendum)
A few things to remember from today's visit:  Urinary frequency - Plan: POCT Urinalysis Dipstick (Automated), PSA, CBC, Basic metabolic panel, Basic metabolic panel, CBC, PSA, Ambulatory referral to Urology  Constipation, unspecified constipation type - Plan: Basic metabolic panel, Basic metabolic panel, linaclotide (LINZESS) 145 MCG CAPS capsule  Right-sided low back pain with right-sided sciatica, unspecified chronicity - Plan: predniSONE (DELTASONE) 20 MG tablet  Benign prostatic hyperplasia with urinary frequency - Plan: Ambulatory referral to Urology  Miralax daily for 5 days then every other day. If still constipated, add bisacodyl 5 mg daily at night. Linzess can be started in the morning before breakfast if constipation does not improve with above measures.  Continue Flomax.  For back pain and right foot tingling sensation, Prednisone.Take it with breakfast.  If you need refills for medications you take chronically, please call your pharmacy. Do not use My Chart to request refills or for acute issues that need immediate attention. If you send a my chart message, it may take a few days to be addressed, specially if I am not in the office.  Please be sure medication list is accurate. If a new problem present, please set up appointment sooner than planned today.

## 2022-11-13 NOTE — Progress Notes (Incomplete)
ACUTE VISIT Chief Complaint  Patient presents with  . Urinary Frequency   HPI: [name] is a 52 y.o. male with past medical history significant for GERD, BPH, vitamin D deficiency, and hypothyroidism  here today complaining of urinary symptoms.  Last visit ,09/06/22,he was complaining of epigastric abdominal pain, which he thought it was aggravated by Flomax. Flomax 0.4 mg daily was added in 03/2022 to help with urinary symptoms, it was changed to Doxazosin, which did not help, so he stopped.  Urinary Frequency  This is a recurrent problem. The current episode started more than 1 month ago. The problem occurs intermittently. The problem has been gradually worsening. There has been no fever. He is Sexually active. There is No history of pyelonephritis. Associated symptoms include frequency. Pertinent negatives include no chills, discharge, flank pain, hesitancy, nausea, sweats or vomiting.   Recently, He has been experiencing an urgency and increased in urinary frequency, urinating every 15 minutes. He started taking Flomax again, but it has not yet provided relief.  He reports no rectal pain,dysuria,or gross hematuria.  Lab Results  Component Value Date   PSA 0.64 01/18/2022   PSA 0.72 08/01/2021   PSA 0.48 11/19/2019   Mention that he is still having GI symptoms. He has been taking Pantoprazole twice a day as suggested by his gastroenterologist, thought may be contributing to wt gain,so discontinued and currently on Omeprazole 40 mg bid; which helps. He continues to experience occasional sensation of something in his throat, but no dysphagia. Acid reflux with certain activities like going to the gym or doing yard work and usually Mondays and weekends. He has been managing his diet and found that eating a banana and yogurt with flaxseed in the morning helps.  He also reports constipation, which he believes may be related to his Omeprazole. He has bowel movements every other  day, which can be hard and difficult to pass. He has tried increasing his fiber intake but has not yet tried OTC medication. Negative for abdominal pain, melena,or blood on stool.  For the past six weeks, he has been experiencing pain in his lower back and buttocks on the right side, which started after doing leg press exercises. He reports that the pain is worse when lying down and that acetaminophen helps alleviate it. Additionally, he has experienced tingling in his right foot for the past two weeks, but no weakness in the leg. Negative for saddle anesthesia.  Review of Systems  Constitutional:  Negative for chills and unexpected weight change.  Respiratory:  Negative for cough, shortness of breath and wheezing.   Cardiovascular:  Negative for chest pain and palpitations.  Gastrointestinal:  Negative for nausea and vomiting.  Endocrine: Negative for cold intolerance and heat intolerance.  Genitourinary:  Positive for frequency. Negative for flank pain, hesitancy, penile discharge and testicular pain.  Skin:  Negative for rash.  Neurological:  Negative for syncope and weakness.  See other pertinent positives and negatives in HPI.  Current Outpatient Medications on File Prior to Visit  Medication Sig Dispense Refill  . famotidine (PEPCID) 40 MG tablet TAKE 1 TABLET BY MOUTH EVERYDAY AT BEDTIME 90 tablet 1  . levothyroxine (SYNTHROID) 150 MCG tablet Take 1 tablet (150 mcg total) by mouth daily. 90 tablet 3  . Multiple Vitamin (MULTIVITAMIN PO) Take by mouth daily.    . pantoprazole (PROTONIX) 40 MG tablet TAKE 1 TABLET BY MOUTH EVERY DAY 90 tablet 1  . Vitamin D, Ergocalciferol, (DRISDOL) 1.25 MG (50000  UNIT) CAPS capsule TAKE 1 CAPSULE BY MOUTH ONCE EVERY 3 WEEKS 4 capsule 3   No current facility-administered medications on file prior to visit.   Past Medical History:  Diagnosis Date  . Allergy   . Arthritis   . Chicken pox   . Hyperbilirubinemia   . Rosacea, unspecified    Follows  with dermatologists.  . Thyroid disease    hypo   No Known Allergies  Social History   Socioeconomic History  . Marital status: Married    Spouse name: Not on file  . Number of children: Not on file  . Years of education: Not on file  . Highest education level: Master's degree (e.g., MA, MS, MEng, MEd, MSW, MBA)  Occupational History  . Not on file  Tobacco Use  . Smoking status: Never  . Smokeless tobacco: Never  Vaping Use  . Vaping Use: Never used  Substance and Sexual Activity  . Alcohol use: Yes    Comment: occasionally  . Drug use: No  . Sexual activity: Yes    Partners: Female  Other Topics Concern  . Not on file  Social History Narrative  . Not on file   Social Determinants of Health   Financial Resource Strain: Low Risk  (09/06/2022)   Overall Financial Resource Strain (CARDIA)   . Difficulty of Paying Living Expenses: Not hard at all  Food Insecurity: No Food Insecurity (09/06/2022)   Hunger Vital Sign   . Worried About Charity fundraiser in the Last Year: Never true   . Ran Out of Food in the Last Year: Never true  Transportation Needs: No Transportation Needs (09/06/2022)   PRAPARE - Transportation   . Lack of Transportation (Medical): No   . Lack of Transportation (Non-Medical): No  Physical Activity: Insufficiently Active (09/06/2022)   Exercise Vital Sign   . Days of Exercise per Week: 2 days   . Minutes of Exercise per Session: 50 min  Stress: No Stress Concern Present (09/06/2022)   [place]   . Feeling of Stress : Only a little  Social Connections: Moderately Integrated (09/06/2022)   Social Connection and Isolation Panel [NHANES]   . Frequency of Communication with Friends and Family: Once a week   . Frequency of Social Gatherings with Friends and Family: More than three times a week   . Attends Religious Services: More than 4 times per year   . Active Member of Clubs or Organizations:  No   . Attends Archivist Meetings: Not on file   . Marital Status: Married   Vitals:   11/13/22 1526  BP: 128/80  Pulse: 75  Resp: 12  SpO2: 97%   Body mass index is 34.52 kg/m.  Physical Exam Vitals and nursing note reviewed. Exam conducted with a chaperone present.  Constitutional:      General: He is not in acute distress.    Appearance: He is well-developed.  HENT:     Head: Normocephalic and atraumatic.  Eyes:     Conjunctiva/sclera: Conjunctivae normal.  Cardiovascular:     Rate and Rhythm: Normal rate and regular rhythm.     Pulses:          Dorsalis pedis pulses are 2+ on the right side and 2+ on the left side.     Heart sounds: No murmur heard. Pulmonary:     Effort: Pulmonary effort is normal. No respiratory distress.     Breath sounds:  Normal breath sounds.  Abdominal:     Palpations: Abdomen is soft. There is no hepatomegaly or mass.     Tenderness: There is no abdominal tenderness.  Genitourinary:    Prostate: Enlarged. Not tender and no nodules present.     Rectum: No mass or tenderness. Normal anal tone.  Lymphadenopathy:     Cervical: No cervical adenopathy.  Skin:    General: Skin is warm.     Findings: No erythema or rash.  Neurological:     Mental Status: He is alert and oriented to person, place, and time.     Cranial Nerves: No cranial nerve deficit.     Gait: Gait normal.  Psychiatric:        Mood and Affect: Affect normal. Mood is anxious.   ASSESSMENT AND PLAN:  Mr. Brouhard was seen today for urinary frequency and other health concerns.  Urinary frequency We discussed possible etiologies including BPH,UTI,and prostatitis among some. Urine dipstick done today negative. Rectal exam revealed mildly enlarged prostate, not ender. Continue Flomax for now. Further recommendations will be given according to lab results.  -     POCT Urinalysis Dipstick (Automated) -     PSA; Future -     CBC; Future -     Basic metabolic panel;  Future -     Ambulatory referral to Urology  Constipation, unspecified constipation type Assessment & Plan: Recommend starting Miralax daily for 5 days and then every other day. If still having problem, he can add Bisacodyl 5 mg daily. Continue adequate fiber and fluid intake. Sent Rx for Linzess 145 mcg to start taking if OTC treatments do not help. Last colonoscopy in 2021. Follow with his gastroenterologist if problem doe snot resolve or gets worse.  Orders: -     Basic metabolic panel; Future -     linaCLOtide; Take 1 capsule (145 mcg total) by mouth daily before breakfast.  Dispense: 30 capsule; Refill: 1  Right-sided low back pain with right-sided sciatica, unspecified chronicity Assessment & Plan: Problem has been going on for 6 weeks and now having right toes tingling. Recommend prednisone taper, side effects discussed, recommended taking it with breakfast. I do not think imaging is necessary at this time but lumbar MRI needs to be considered if problem doe snot resolved in a few weeks. Instructed about warning signs.  Orders: -     predniSONE; 2 tabs for 4 days,1 tabs for 3 days, and 1/2 tab for 3 days. Take tables together with breakfast.  Dispense: 14 tablet; Refill: 0  Benign prostatic hyperplasia with urinary frequency Assessment & Plan: Gradually getting worse. Symptoms have not improved with Flomax as it did before but he just started a couple days ago, so continue taking it for a few more weeks.  Decrease caffeine intake. Urology referral placed.   Orders: -     Ambulatory referral to Urology  Gastroesophageal reflux disease with esophagitis without hemorrhage Assessment & Plan: Problem has improved but not well controlled. Other options like Dexilant discussed, he would like to continue Omeprazole 40 mg bid. Continue GERD precautions. Follows with GI.   Return if symptoms worsen or fail to improve, for keep next appointment.  Rutledge Selsor G. Martinique,  MD  [facility]. [place] office.

## 2022-11-14 ENCOUNTER — Ambulatory Visit (INDEPENDENT_AMBULATORY_CARE_PROVIDER_SITE_OTHER): Payer: BC Managed Care – PPO | Admitting: Nurse Practitioner

## 2022-11-14 ENCOUNTER — Encounter: Payer: Self-pay | Admitting: Nurse Practitioner

## 2022-11-14 VITALS — BP 100/72 | HR 76 | Ht 71.0 in | Wt 244.4 lb

## 2022-11-14 DIAGNOSIS — K5900 Constipation, unspecified: Secondary | ICD-10-CM

## 2022-11-14 DIAGNOSIS — K219 Gastro-esophageal reflux disease without esophagitis: Secondary | ICD-10-CM

## 2022-11-14 LAB — BASIC METABOLIC PANEL
BUN: 12 mg/dL (ref 6–23)
CO2: 30 mEq/L (ref 19–32)
Calcium: 9.6 mg/dL (ref 8.4–10.5)
Chloride: 102 mEq/L (ref 96–112)
Creatinine, Ser: 0.93 mg/dL (ref 0.40–1.50)
GFR: 93.68 mL/min (ref >60.00)
Glucose, Bld: 98 mg/dL (ref 70–99)
Potassium: 4.2 mEq/L (ref 3.5–5.1)
Sodium: 140 mEq/L (ref 135–145)

## 2022-11-14 LAB — CBC
HCT: 43.6 % (ref 39.0–52.0)
Hemoglobin: 14.8 g/dL (ref 13.0–17.0)
MCHC: 34 g/dL (ref 30.0–36.0)
MCV: 92.5 fl (ref 78.0–100.0)
Platelets: 236 10*3/uL (ref 150.0–400.0)
RBC: 4.71 Mil/uL (ref 4.22–5.81)
RDW: 13.1 % (ref 11.5–15.5)
WBC: 8.7 10*3/uL (ref 4.0–10.5)

## 2022-11-14 LAB — PSA: PSA: 0.73 ng/mL (ref 0.10–4.00)

## 2022-11-14 NOTE — Patient Instructions (Addendum)
Take Miralax 1 capful mixed in 8 ounces of water at bed time for constipation as tolerated.  Reduce coffee intake to 1 cup daily (8 ounces)  Take 1 or 2 TUMS prior to exercise   Reduce carbohydrates in your diet ie: reduce bread/pasta/potato/rice and sweets   Weight loss recommended, attempt 10 lb weight loss for now  Exercise as tolerated   Continue Omeprazole '40mg'$  one capsule twice daily to be taken 30 minutes before breakfast and dinner   Add Famotidine '20mg'$  one tab at bed time   Contact Valente Fosberg NP or Dr. Havery Moros in 2 weeks with an update   Due to recent changes in healthcare laws, you may see the results of your imaging and laboratory studies on MyChart before your provider has had a chance to review them.  We understand that in some cases there may be results that are confusing or concerning to you. Not all laboratory results come back in the same time frame and the provider may be waiting for multiple results in order to interpret others.  Please give [place] 48 hours in order for your provider to thoroughly review all the results before contacting the office for clarification of your results.    Thank you for trusting me with your gastrointestinal care!   Carl Best, CRNP

## 2022-11-14 NOTE — Progress Notes (Signed)
11/14/2022 Daniel Juarez [id number] 1971-06-27   Chief Complaint: Acid reflux   History of Present Illness: Daniel Juarez is a 52 year old male with a past medical history of arthritis, hyperbilirubinemia, hypothyroidism, rosacea and GERD symptoms. He is known by Dr. Havery Moros.  He underwent an EGD 07/30/2022 secondary to having reflux symptoms.  The EGD was normal without evidence of esophagitis or Barrett's esophagus.  At that time, he was instructed to take omeprazole 40 mg twice daily for few weeks and if his symptoms did not improve further testing would be considered.  Reported taking Omeprazole 40 mg twice daily for about 1 week post EGD and his symptoms abated so he stopped taking it, however, his symptoms recurred a few days later so he went back on Omeprazole twice daily.  He eventually reduced Omeprazole to once daily but recently had recurrence of reflux symptoms and epigastric burning so he is back on Omeprazole 40 mg twice daily and his heartburn symptoms have improved. However, he continues to experience heartburn if he is working in his yard and bending down a lot as well as during exercise.  A few times, he felt as if his esophagus or airway was tightening.  No respiratory distress symptoms.  Tomato products also trigger his heartburn.  He reported gaining 10 pounds within the past year.  He drinks 2 to 3 cups of coffee daily.  He is passing a normal formed brown BM every other day.  No rectal bleeding or black stools.  He underwent a colonoscopy 10/04/2019 which identified 1 sessile serrated polyp removed from the colon 1 transverse diverticula and internal hemorrhoids.  He was advised to repeat a colonoscopy in 7 years.       Latest Ref Rng & Units 11/13/2022    4:18 PM 06/14/2022    2:51 PM 08/01/2021    9:37 AM  CMP  Glucose 70 - 99 mg/dL 98  88  77   BUN 6 - 23 mg/dL '12  18  17   '$ Creatinine 0.40 - 1.50 mg/dL 0.93  0.95  0.92   Sodium 135 - 145 mEq/L 140  139  141    Potassium 3.5 - 5.1 mEq/L 4.2  4.3  4.4   Chloride 96 - 112 mEq/L 102  102  102   CO2 19 - 32 mEq/L '30  30  29   '$ Calcium 8.4 - 10.5 mg/dL 9.6  10.0  9.6   Total Protein 6.0 - 8.3 g/dL  7.8  7.3   Total Bilirubin 0.2 - 1.2 mg/dL  1.3  1.8   Alkaline Phos 39 - 117 U/L  61  54   AST 0 - 37 U/L  24  24   ALT 0 - 53 U/L  31  29        Latest Ref Rng & Units 11/13/2022    4:18 PM 06/14/2022    2:51 PM 08/01/2021    9:37 AM  CBC  WBC 4.0 - 10.5 K/uL 8.7  6.9  6.3   Hemoglobin 13.0 - 17.0 g/dL 14.8  14.6  15.4   Hematocrit 39.0 - 52.0 % 43.6  44.3  46.0   Platelets 150.0 - 400.0 K/uL 236.0  243.0  219.0      EGD 07/30/2022: - Esophagogastric landmarks identified. - Z-line slightly irregular but no evidence of Barrett's esophagus. - Normal esophagus otherwise - biopsies taken to rule out eosinophilic esophagitis - Normal stomach. - Normal examined duodenum. -  SQUAMOUS MUCOSA WITH NO SIGNIFICANT PATHOLOGY.  Colonoscopy 10/04/2019: - The perianal and digital rectal examinations were normal.  - A 4 mm polyp was found in the transverse colon. The polyp was flat. The polyp was removed with a cold snare. Resection and retrieval were complete.  - A single medium-mouthed diverticulum was found in the transverse colon.  - Internal hemorrhoids were found during retroflexion.  - The exam was otherwise without abnormality -7-year recall colonoscopy SESSILE SERRATED POLYP. - NO DYSPLASIA OR MALIGNANCY  Current Outpatient Medications on File Prior to Visit  Medication Sig Dispense Refill   levothyroxine (SYNTHROID) 150 MCG tablet Take 1 tablet (150 mcg total) by mouth daily. 90 tablet 3   Multiple Vitamin (MULTIVITAMIN PO) Take by mouth daily.     omeprazole (PRILOSEC) 40 MG capsule Take 40 mg by mouth 2 (two) times daily.     Vitamin D, Ergocalciferol, (DRISDOL) 1.25 MG (50000 UNIT) CAPS capsule TAKE 1 CAPSULE BY MOUTH ONCE EVERY 3 WEEKS 4 capsule 3   famotidine (PEPCID) 40 MG tablet TAKE 1  TABLET BY MOUTH EVERYDAY AT BEDTIME (Patient not taking: Reported on 11/14/2022) 90 tablet 1   linaclotide (LINZESS) 145 MCG CAPS capsule Take 1 capsule (145 mcg total) by mouth daily before breakfast. (Patient not taking: Reported on 11/14/2022) 30 capsule 1   predniSONE (DELTASONE) 20 MG tablet 2 tabs for 4 days,1 tabs for 3 days, and 1/2 tab for 3 days. Take tables together with breakfast. (Patient not taking: Reported on 11/14/2022) 14 tablet 0   No current facility-administered medications on file prior to visit.   No Known Allergies  Current Medications, Allergies, Past Medical History, Past Surgical History, Family History and Social History were reviewed in [employer] record.  Review of Systems:   Constitutional: Negative for fever, sweats, chills or weight loss.  Respiratory: Negative for shortness of breath.   Cardiovascular: Negative for chest pain, palpitations and leg swelling.  Gastrointestinal: See HPI.  Musculoskeletal: Negative for back pain or muscle aches.  Neurological: Negative for dizziness, headaches or paresthesias.   Physical Exam: BP 100/72 (BP Location: Left Arm, Patient Position: Sitting, Cuff Size: Large)   Pulse 76   Ht '5\' 11"'$  (1.803 m)   Wt 244 lb 6 oz (110.8 kg)   BMI 34.08 kg/m  General: 52 year old male in no acute distress. Head: Normocephalic and atraumatic. Eyes: No scleral icterus. Conjunctiva pink . Ears: Normal auditory acuity. Mouth: Dentition intact. No ulcers or lesions.  Lungs: Clear throughout to auscultation. Heart: Regular rate and rhythm, no murmur. Abdomen: Soft, nontender and nondistended. No masses or hepatomegaly. Normal bowel sounds x 4 quadrants.  Rectal: Deferred. Musculoskeletal: Symmetrical with no gross deformities. Extremities: No edema. Neurological: Alert oriented x 4. No focal deficits.  Psychological: Alert and cooperative. Normal mood and affect  Assessment and Recommendations:  52 year old  male with GERD symptoms which are exacerbated by exercise and when bending down to pick something up from the floor or while doing yard work.  He underwent an EGD 07/2022 which was normal, no abnormality identified to the LES.  I suspect his weight gain, coffee intake and constipation are contributing to his symptoms.  Overall, he feels his heartburn symptoms have improved on Omeprazole twice daily. -Decrease coffee intake to 8 ounces daily -Reduce carbohydrate intake in diet, recommended 10 pound weight loss -Take 1 or 2 Tums prior to exercise or yard work -Continue Omeprazole 40 mg p.o. twice daily to be taken 30 minutes before breakfast  and dinner.  Add Famotidine 20 mg 1 p.o. nightly. -Patient to contact office in 2 weeks with an update, sooner if symptoms worsen  Constipation -MiraLAX nightly to increase stool output as tolerated  History of a sessile serrated polyp per colonoscopy 10/2019 -Next colonoscopy due 10/2026

## 2022-11-14 NOTE — Progress Notes (Signed)
Agree with assessment / plan as outlined.  

## 2022-11-14 NOTE — Assessment & Plan Note (Signed)
Recommend starting Miralax daily for 5 days and then every other day. If still having problem, he can add Bisacodyl 5 mg daily. Continue adequate fiber and fluid intake. Sent Rx for Linzess 145 mcg to start taking if OTC treatments do not help. Last colonoscopy in 2021. Follow with his gastroenterologist if problem doe snot resolve or gets worse.

## 2022-12-01 ENCOUNTER — Other Ambulatory Visit: Payer: Self-pay | Admitting: Family Medicine

## 2022-12-01 DIAGNOSIS — N138 Other obstructive and reflux uropathy: Secondary | ICD-10-CM

## 2022-12-02 DIAGNOSIS — R7309 Other abnormal glucose: Secondary | ICD-10-CM | POA: Diagnosis not present

## 2022-12-02 NOTE — Progress Notes (Unsigned)
ACUTE VISIT No chief complaint on file.  HPI: [name] is a 52 y.o. male, who is here today complaining of *** HPI  Review of Systems See other pertinent positives and negatives in HPI.  Current Outpatient Medications on File Prior to Visit  Medication Sig Dispense Refill   famotidine (PEPCID) 40 MG tablet TAKE 1 TABLET BY MOUTH EVERYDAY AT BEDTIME (Patient not taking: Reported on 11/14/2022) 90 tablet 1   levothyroxine (SYNTHROID) 150 MCG tablet Take 1 tablet (150 mcg total) by mouth daily. 90 tablet 3   linaclotide (LINZESS) 145 MCG CAPS capsule Take 1 capsule (145 mcg total) by mouth daily before breakfast. (Patient not taking: Reported on 11/14/2022) 30 capsule 1   Multiple Vitamin (MULTIVITAMIN PO) Take by mouth daily.     omeprazole (PRILOSEC) 40 MG capsule Take 40 mg by mouth 2 (two) times daily.     Vitamin D, Ergocalciferol, (DRISDOL) 1.25 MG (50000 UNIT) CAPS capsule TAKE 1 CAPSULE BY MOUTH ONCE EVERY 3 WEEKS 4 capsule 3   No current facility-administered medications on file prior to visit.    Past Medical History:  Diagnosis Date   Allergy    Arthritis    Chicken pox    Hyperbilirubinemia    Rosacea, unspecified    Follows with dermatologists.   Thyroid disease    hypo   No Known Allergies  Social History   Socioeconomic History   Marital status: Married    Spouse name: Not on file   Number of children: 3   Years of education: Not on file   Highest education level: Master's degree (e.g., MA, MS, MEng, MEd, MSW, MBA)  Occupational History   Not on file  Tobacco Use   Smoking status: Never   Smokeless tobacco: Never  Vaping Use   Vaping Use: Never used  Substance and Sexual Activity   Alcohol use: Yes    Comment: occasionally   Drug use: No   Sexual activity: Yes    Partners: Female  Other Topics Concern   Not on file  Social History Narrative   Not on file   Social Determinants of Health   Financial Resource Strain: Low Risk   (09/06/2022)   Overall Financial Resource Strain (CARDIA)    Difficulty of Paying Living Expenses: Not hard at all  Food Insecurity: No Food Insecurity (09/06/2022)   Hunger Vital Sign    Worried About Running Out of Food in the Last Year: Never true    [place]rett in the Last Year: Never true  Transportation Needs: No Transportation Needs (09/06/2022)   PRAPARE - Hydrologist (Medical): No    Lack of Transportation (Non-Medical): No  Physical Activity: Insufficiently Active (09/06/2022)   Exercise Vital Sign    Days of Exercise per Week: 2 days    Minutes of Exercise per Session: 50 min  Stress: No Stress Concern Present (09/06/2022)   [place]    Feeling of Stress : Only a little  Social Connections: Moderately Integrated (09/06/2022)   Social Connection and Isolation Panel [NHANES]    Frequency of Communication with Friends and Family: Once a week    Frequency of Social Gatherings with Friends and Family: More than three times a week    Attends Religious Services: More than 4 times per year    Active Member of [employer] or Organizations: No    Attends Archivist Meetings: Not  on file    Marital Status: Married    There were no vitals filed for this visit. There is no height or weight on file to calculate BMI.  Physical Exam  ASSESSMENT AND PLAN: There are no diagnoses linked to this encounter.  No follow-ups on file.  Jevin Camino G. Martinique, MD  [facility]. [place] office.  Discharge Instructions   None

## 2022-12-03 ENCOUNTER — Encounter: Payer: BC Managed Care – PPO | Admitting: Urology

## 2022-12-03 ENCOUNTER — Ambulatory Visit (INDEPENDENT_AMBULATORY_CARE_PROVIDER_SITE_OTHER): Payer: BC Managed Care – PPO | Admitting: Family Medicine

## 2022-12-03 ENCOUNTER — Encounter: Payer: Self-pay | Admitting: Family Medicine

## 2022-12-03 VITALS — BP 120/70 | HR 79 | Temp 98.8°F | Resp 12 | Ht 71.0 in | Wt 241.4 lb

## 2022-12-03 DIAGNOSIS — R200 Anesthesia of skin: Secondary | ICD-10-CM

## 2022-12-03 DIAGNOSIS — R202 Paresthesia of skin: Secondary | ICD-10-CM | POA: Diagnosis not present

## 2022-12-03 DIAGNOSIS — M5441 Lumbago with sciatica, right side: Secondary | ICD-10-CM | POA: Diagnosis not present

## 2022-12-03 MED ORDER — GABAPENTIN 300 MG PO CAPS
300.0000 mg | ORAL_CAPSULE | Freq: Every day | ORAL | 0 refills | Status: DC
Start: 2022-12-03 — End: 2023-01-06

## 2022-12-03 NOTE — Patient Instructions (Addendum)
A few things to remember from today's visit:  Right-sided low back pain with right-sided sciatica, unspecified chronicity - Plan: MR Lumbar Spine Wo Contrast, Ambulatory referral to Orthopedics  Numbness and tingling of right lower extremity - Plan: MR Lumbar Spine Wo Contrast Monitor for new symptoms. Gabapentin 300 mg at bedtime can be started and continue until you see orthopedist.  If you need refills for medications you take chronically, please call your pharmacy. Do not use My Chart to request refills or for acute issues that need immediate attention. If you send a my chart message, it may take a few days to be addressed, specially if I am not in the office.  Please be sure medication list is accurate. If a new problem present, please set up appointment sooner than planned today.

## 2022-12-13 ENCOUNTER — Telehealth: Payer: Self-pay | Admitting: Family Medicine

## 2022-12-13 NOTE — Telephone Encounter (Signed)
Pt called [employer] and they are saying they have not received referral [phone] is asking that it be resent

## 2022-12-16 ENCOUNTER — Ambulatory Visit: Payer: BC Managed Care – PPO | Admitting: Orthopedic Surgery

## 2022-12-20 ENCOUNTER — Ambulatory Visit
Admission: RE | Admit: 2022-12-20 | Discharge: 2022-12-20 | Disposition: A | Discharge status: Home / Self Care | Payer: BC Managed Care – PPO | Source: Ambulatory Visit | Attending: Family Medicine | Admitting: Family Medicine

## 2022-12-20 DIAGNOSIS — R200 Anesthesia of skin: Secondary | ICD-10-CM

## 2022-12-20 DIAGNOSIS — M48061 Spinal stenosis, lumbar region without neurogenic claudication: Secondary | ICD-10-CM | POA: Diagnosis not present

## 2022-12-20 DIAGNOSIS — M5416 Radiculopathy, lumbar region: Secondary | ICD-10-CM | POA: Diagnosis not present

## 2022-12-20 DIAGNOSIS — M5441 Lumbago with sciatica, right side: Secondary | ICD-10-CM

## 2022-12-23 ENCOUNTER — Telehealth: Payer: Self-pay | Admitting: Orthopedic Surgery

## 2022-12-23 ENCOUNTER — Ambulatory Visit (INDEPENDENT_AMBULATORY_CARE_PROVIDER_SITE_OTHER): Payer: BC Managed Care – PPO | Admitting: Orthopedic Surgery

## 2022-12-23 ENCOUNTER — Other Ambulatory Visit (INDEPENDENT_AMBULATORY_CARE_PROVIDER_SITE_OTHER): Payer: BC Managed Care – PPO

## 2022-12-23 VITALS — BP 121/84 | HR 60 | Ht 71.0 in | Wt 241.5 lb

## 2022-12-23 DIAGNOSIS — M5441 Lumbago with sciatica, right side: Secondary | ICD-10-CM | POA: Diagnosis not present

## 2022-12-23 NOTE — Telephone Encounter (Signed)
Patient asking if the MRI result are available for Dr. Christell Constant he has an appointment this afternoon and he doesn't see any results but wants to make sure Dr. Christell Constant can see them . If Not ready please call and reschedule.

## 2022-12-23 NOTE — Telephone Encounter (Signed)
I called and lmom that we will have the report by his appointment time this afternoon. I did call [facility] and they have moved it to the top to be read.

## 2022-12-23 NOTE — Progress Notes (Signed)
Orthopedic Spine Surgery Office Note  Assessment: Patient is a 52 y.o. male with right leg pain that goes into his lateral thigh and posterior leg   Plan: -Explained that initially conservative treatment is tried as a significant number of patients may experience relief with these treatment modalities. Discussed that the conservative treatments include:  -activity modification  -physical therapy  -over the counter pain medications  -medrol dosepak  -lumbar steroid injections -Patient has tried tylenol, medrol dosepak, gabapentin  -Recommended L4/5 diagnostic and therapeutic injection  -Patient should return to office in 4 weeks, x-rays at next visit: none   Patient expressed understanding of the plan and all questions were answered to the patient's satisfaction.   ___________________________________________________________________________   History:  Patient is a 52 y.o. male who presents today for lumbar spine. Patient has had 3 months of right leg pain. He feels that this started around the tim that he started doing more weight lifting type work outs. He previously was just doing cardio work outs. He does not recall a specific injury or incident that then lead to the leg pain. He is not having any right sided leg pain. Pain is felt along the lateral thigh and goes into the posterior leg. He gets paresthesias in this same distribution and into the foot. No other numbness or paresthesias.    Weakness: denies Symptoms of imbalance: denies Paresthesias and numbness: yes, along the lateral aspect of the right thigh and posterior leg. No other numbness or paresthesias Bowel or bladder incontinence: denies Saddle anesthesia: denies  Treatments tried: tylenol, medrol dosepak, gabapentin  Review of systems: Denies fevers and chills, night sweats, unexplained weight loss, history of cancer. Has had pain that wakes him at night  Past medical  history: Hypothyroidism BPH GERD  Allergies: NKDA  Past surgical history:  ACDF Tonsillectomy Vasectomy  Social history: Denies use of nicotine product (smoking, vaping, patches, smokeless) Alcohol use: denies Denies recreational drug use   Physical Exam:  General: no acute distress, appears stated age Neurologic: alert, answering questions appropriately, following commands Respiratory: unlabored breathing on room air, symmetric chest rise Psychiatric: appropriate affect, normal cadence to speech   MSK (spine):  -Strength exam      Left  Right EHL    5/5  5/5 TA    5/5  5/5 GSC    5/5  5/5 Knee extension  5/5  5/5 Hip flexion   5/5  5/5  -Sensory exam    Sensation intact to light touch in L3-S1 nerve distributions of bilateral lower extremities  -Achilles DTR: 1/4 on the left, 1/4 on the right -Patellar tendon DTR: 1/4 on the left, 1/4 on the right  -Straight leg raise: negative  -Contralateral straight leg raise: negative  -Femoral nerve stretch test: negative bilaterally -Clonus: no beats bilaterally  -Left hip exam: no pain through range of motion, negative stinchfied, negative faber -Right hip exam: no pain through range of motion, negative stinchfied, negative faber  Imaging: XR of the lumbar spine from 12/23/2022 was independently reviewed and interpreted, showing disc height loss at L4/5 and L5/S1. No evidence of instability on flexion/extension views. No fracture or dislocation seen.   MRI of the lumbar spine from 12/20/2022 was independently reviewed and interpreted, showing L4/5 disc protrusion that is greater on the right side, lateral recess stenosis at L4/5. DDD at L4/5 and L5/S1.    Patient name: Daniel Juarez Patient MRN: [id number] Date of visit: 12/23/22

## 2023-01-01 DIAGNOSIS — R7309 Other abnormal glucose: Secondary | ICD-10-CM | POA: Diagnosis not present

## 2023-01-03 ENCOUNTER — Other Ambulatory Visit: Payer: Self-pay | Admitting: Family Medicine

## 2023-01-03 DIAGNOSIS — R200 Anesthesia of skin: Secondary | ICD-10-CM

## 2023-01-03 DIAGNOSIS — M5441 Lumbago with sciatica, right side: Secondary | ICD-10-CM

## 2023-01-09 ENCOUNTER — Ambulatory Visit (INDEPENDENT_AMBULATORY_CARE_PROVIDER_SITE_OTHER): Payer: BC Managed Care – PPO | Admitting: Physical Medicine and Rehabilitation

## 2023-01-09 ENCOUNTER — Other Ambulatory Visit: Payer: Self-pay

## 2023-01-09 VITALS — BP 104/71 | HR 80

## 2023-01-09 DIAGNOSIS — M5416 Radiculopathy, lumbar region: Secondary | ICD-10-CM

## 2023-01-09 MED ORDER — METHYLPREDNISOLONE ACETATE 80 MG/ML IJ SUSP
80.0000 mg | Freq: Once | INTRAMUSCULAR | Status: AC
  Administered 2023-01-09: 80 mg

## 2023-01-09 NOTE — Patient Instructions (Signed)

## 2023-01-09 NOTE — Progress Notes (Signed)
Functional Pain Scale - descriptive words and definitions  Moderate (4)   Constantly aware of pain, can complete ADLs with modification/sleep marginally affected at times/passive distraction is of no use, but active distraction gives some relief. Moderate range order  Average Pain  varies   +Driver, -BT, -Dye Allergies.  Lower back pain on the right

## 2023-01-21 NOTE — Progress Notes (Signed)
Daniel Juarez - 52 y.o. male MRN [id number]  Date of birth: 09/15/1970  Office Visit Note: Visit Date: 01/09/2023 PCP: [place], Betty G, MD Referred by: London Sheer, MD  Subjective: Chief Complaint  Patient presents with   Lower Back - Pain   HPI:  Daniel Juarez is a 52 y.o. male who comes in today at the request of Dr. Willia Craze for planned Right L4-5 Lumbar Transforaminal epidural steroid injection with fluoroscopic guidance.  The patient has failed conservative care including home exercise, medications, time and activity modification.  This injection will be diagnostic and hopefully therapeutic.  Please see requesting physician notes for further details and justification.   ROS Otherwise per HPI.  Assessment & Plan: Visit Diagnoses:    ICD-10-CM   1. Lumbar radiculopathy  M54.16 XR C-ARM NO REPORT    Epidural Steroid injection    methylPREDNISolone acetate (DEPO-MEDROL) injection 80 mg      Plan: No additional findings.   Meds & Orders:  Meds ordered this encounter  Medications   methylPREDNISolone acetate (DEPO-MEDROL) injection 80 mg    Orders Placed This Encounter  Procedures   XR C-ARM NO REPORT   Epidural Steroid injection    Follow-up: No follow-ups on file.   Procedures: No procedures performed  Lumbosacral Transforaminal Epidural Steroid Injection - Sub-Pedicular Approach with Fluoroscopic Guidance  [name]      Date of Birth: Dec 09, 1970 MRN: [id number] PCP: [place], Betty G, MD      Visit Date: 01/09/2023   Universal Protocol:    Date/Time: 01/09/2023  Consent Given By: the patient  Position: PRONE  Additional Comments: Vital signs were monitored before and after the procedure. Patient was prepped and draped in the usual sterile fashion. The correct patient, procedure, and site was verified.   Injection Procedure Details:   Procedure diagnoses: Lumbar radiculopathy [M54.16]    Meds Administered:  Meds  ordered this encounter  Medications   methylPREDNISolone acetate (DEPO-MEDROL) injection 80 mg    Laterality: Right  Location/Site: L4  Needle:5.0 in., 22 ga.  Short bevel or Quincke spinal needle  Needle Placement: Transforaminal  Findings:    -Comments: Excellent flow of contrast along the nerve, nerve root and into the epidural space.  Procedure Details: After squaring off the end-plates to get a true AP view, the C-arm was positioned so that an oblique view of the foramen as noted above was visualized. The target area is just inferior to the "nose of the scotty dog" or sub pedicular. The soft tissues overlying this structure were infiltrated with 2-3 ml. of 1% Lidocaine without Epinephrine.  The spinal needle was inserted toward the target using a "trajectory" view along the fluoroscope beam.  Under AP and lateral visualization, the needle was advanced so it did not puncture dura and was located close the 6 O'Clock position of the pedical in AP tracterory. Biplanar projections were used to confirm position. Aspiration was confirmed to be negative for CSF and/or blood. A 1-2 ml. volume of Isovue-250 was injected and flow of contrast was noted at each level. Radiographs were obtained for documentation purposes.   After attaining the desired flow of contrast documented above, a 0.5 to 1.0 ml test dose of 0.25% Marcaine was injected into each respective transforaminal space.  The patient was observed for 90 seconds post injection.  After no sensory deficits were reported, and normal lower extremity motor function was noted,   the above injectate was administered so that  equal amounts of the injectate were placed at each foramen (level) into the transforaminal epidural space.   Additional Comments:  No complications occurred Dressing: 2 x 2 sterile gauze and Band-Aid    Post-procedure details: Patient was observed during the procedure. Post-procedure instructions were  reviewed.  Patient left the clinic in stable condition.    Clinical History: MRI LUMBAR SPINE WITHOUT CONTRAST   TECHNIQUE: Multiplanar, multisequence MR imaging of the lumbar spine was performed. No intravenous contrast was administered.   COMPARISON:  None Available.   FINDINGS: Segmentation: Standard segmentation is assumed. The inferior-most fully formed intervertebral disc is labeled L5-S1.   Alignment:  No substantial sagittal subluxation.   Vertebrae: L4 superior endplate degenerative Schmorl's node with associated edema. Chronic appearing degenerative/discogenic endplate signal changes at L5-S1. No additional focal marrow signal to suggest acute fracture or discitis/osteomyelitis. No suspicious bone lesions   Conus medullaris and cauda equina: Conus extends to the inferior T12 level. Conus appears normal.   Paraspinal and other soft tissues: Unremarkable.   Disc levels:   T12-L1: No significant disc protrusion, foraminal stenosis, or canal stenosis.   L1-L2: No significant disc protrusion, foraminal stenosis, or canal stenosis.   L2-L3: Small right far lateral/extraforaminal foraminal disc protrusion which comes in close proximity to the exiting right L2 nerve without significant stenosis or impingement. No significant canal or left foraminal stenosis.   L3-L4: No significant disc protrusion, foraminal stenosis, or canal stenosis.   L4-L5: Broad disc bulging. Ligamentum flavum thickening and facet arthropathy. Resulting moderate right subarticular recess stenosis which could affect the descending right L5 nerve roots. Mild-to-moderate bilateral foraminal stenosis.   L5-S1: Disc height loss and desiccation. Disc bulging and endplate spurring. Mild bilateral foraminal stenosis. No significant canal stenosis.   IMPRESSION: 1. At L4-L5, moderate right subarticular recess narrowing which could affect the descending right L5 nerve roots. 2. At L2-L3, right  lateral/extraforaminal disc protrusion comes in close proximity to the exiting right L2 nerve without significant stenosis or impingement. 3. Mild bilateral foraminal stenosis at L4-L5 and L5-S1.     Electronically Signed   By: Feliberto Harts M.D.   On: 12/23/2022 11:16     Objective:  VS:  HT:    WT:   BMI:     BP:104/71  HR:80bpm  TEMP: ( )  RESP:  Physical Exam Vitals and nursing note reviewed.  Constitutional:      General: He is not in acute distress.    Appearance: Normal appearance. He is not ill-appearing.  HENT:     Head: Normocephalic and atraumatic.     Right Ear: External ear normal.     Left Ear: External ear normal.     Nose: No congestion.  Eyes:     Extraocular Movements: Extraocular movements intact.  Cardiovascular:     Rate and Rhythm: Normal rate.     Pulses: Normal pulses.  Pulmonary:     Effort: Pulmonary effort is normal. No respiratory distress.  Abdominal:     General: There is no distension.     Palpations: Abdomen is soft.  Musculoskeletal:        General: No tenderness or signs of injury.     Cervical back: Neck supple.     Right lower leg: No edema.     Left lower leg: No edema.     Comments: Patient has good distal strength without clonus.  Skin:    Findings: No erythema or rash.  Neurological:     General: No  focal deficit present.     Mental Status: He is alert and oriented to person, place, and time.     Sensory: No sensory deficit.     Motor: No weakness or abnormal muscle tone.     Coordination: Coordination normal.  Psychiatric:        Mood and Affect: Mood normal.        Behavior: Behavior normal.      Imaging: No results found.

## 2023-01-21 NOTE — Procedures (Signed)
Lumbosacral Transforaminal Epidural Steroid Injection - Sub-Pedicular Approach with Fluoroscopic Guidance  [name]      Date of Birth: 1970/11/09 MRN: [id number] PCP: [place], Betty G, MD      Visit Date: 01/09/2023   Universal Protocol:    Date/Time: 01/09/2023  Consent Given By: the patient  Position: PRONE  Additional Comments: Vital signs were monitored before and after the procedure. Patient was prepped and draped in the usual sterile fashion. The correct patient, procedure, and site was verified.   Injection Procedure Details:   Procedure diagnoses: Lumbar radiculopathy [M54.16]    Meds Administered:  Meds ordered this encounter  Medications   methylPREDNISolone acetate (DEPO-MEDROL) injection 80 mg    Laterality: Right  Location/Site: L4  Needle:5.0 in., 22 ga.  Short bevel or Quincke spinal needle  Needle Placement: Transforaminal  Findings:    -Comments: Excellent flow of contrast along the nerve, nerve root and into the epidural space.  Procedure Details: After squaring off the end-plates to get a true AP view, the C-arm was positioned so that an oblique view of the foramen as noted above was visualized. The target area is just inferior to the "nose of the scotty dog" or sub pedicular. The soft tissues overlying this structure were infiltrated with 2-3 ml. of 1% Lidocaine without Epinephrine.  The spinal needle was inserted toward the target using a "trajectory" view along the fluoroscope beam.  Under AP and lateral visualization, the needle was advanced so it did not puncture dura and was located close the 6 O'Clock position of the pedical in AP tracterory. Biplanar projections were used to confirm position. Aspiration was confirmed to be negative for CSF and/or blood. A 1-2 ml. volume of Isovue-250 was injected and flow of contrast was noted at each level. Radiographs were obtained for documentation purposes.   After attaining the desired  flow of contrast documented above, a 0.5 to 1.0 ml test dose of 0.25% Marcaine was injected into each respective transforaminal space.  The patient was observed for 90 seconds post injection.  After no sensory deficits were reported, and normal lower extremity motor function was noted,   the above injectate was administered so that equal amounts of the injectate were placed at each foramen (level) into the transforaminal epidural space.   Additional Comments:  No complications occurred Dressing: 2 x 2 sterile gauze and Band-Aid    Post-procedure details: Patient was observed during the procedure. Post-procedure instructions were reviewed.  Patient left the clinic in stable condition.

## 2023-02-01 DIAGNOSIS — R7309 Other abnormal glucose: Secondary | ICD-10-CM | POA: Diagnosis not present

## 2023-02-24 ENCOUNTER — Telehealth: Payer: Self-pay | Admitting: Physical Medicine and Rehabilitation

## 2023-02-24 NOTE — Telephone Encounter (Signed)
Pt is going out of town and requesting a back injection if possible. Please call pt at [phone].

## 2023-02-25 ENCOUNTER — Telehealth: Payer: Self-pay | Admitting: Physical Medicine and Rehabilitation

## 2023-02-25 ENCOUNTER — Other Ambulatory Visit: Payer: Self-pay | Admitting: Radiology

## 2023-02-25 DIAGNOSIS — M5416 Radiculopathy, lumbar region: Secondary | ICD-10-CM

## 2023-02-25 DIAGNOSIS — M5441 Lumbago with sciatica, right side: Secondary | ICD-10-CM

## 2023-02-25 NOTE — Telephone Encounter (Signed)
Pt called asking for a right away appt for back injection please call pt when you can to set an back injection. Pt phone number [phone]

## 2023-02-25 NOTE — Telephone Encounter (Signed)
Spoke with patient and he is requesting a back injection right away. Informed him that Dr. Bettles Blas first available is 03/17/23. He stated he needs it before then. Informed him that if Dr. Christell Constant wants to he can put it in for [facility]. He would like that to be done.

## 2023-02-25 NOTE — Telephone Encounter (Signed)
See previous encounter

## 2023-02-26 ENCOUNTER — Telehealth: Payer: Self-pay | Admitting: Physical Medicine and Rehabilitation

## 2023-02-26 NOTE — Telephone Encounter (Signed)
Patient called asked for a call back needing to know who he was referred to. The number to contact patient is [phone]

## 2023-02-26 NOTE — Telephone Encounter (Signed)
Patient notified that order for injection was sent to [facility]

## 2023-02-27 ENCOUNTER — Ambulatory Visit
Admission: RE | Admit: 2023-02-27 | Discharge: 2023-02-27 | Disposition: A | Discharge status: Home / Self Care | Payer: BC Managed Care – PPO | Source: Ambulatory Visit | Attending: Orthopedic Surgery | Admitting: Orthopedic Surgery

## 2023-02-27 DIAGNOSIS — M5441 Lumbago with sciatica, right side: Secondary | ICD-10-CM

## 2023-02-27 DIAGNOSIS — M4727 Other spondylosis with radiculopathy, lumbosacral region: Secondary | ICD-10-CM | POA: Diagnosis not present

## 2023-02-27 DIAGNOSIS — M5416 Radiculopathy, lumbar region: Secondary | ICD-10-CM

## 2023-02-27 MED ORDER — IOPAMIDOL (ISOVUE-M 200) INJECTION 41%
1.0000 mL | Freq: Once | INTRAMUSCULAR | Status: AC
  Administered 2023-02-27: 1 mL via EPIDURAL

## 2023-02-27 MED ORDER — METHYLPREDNISOLONE ACETATE 40 MG/ML INJ SUSP (RADIOLOG
80.0000 mg | Freq: Once | INTRAMUSCULAR | Status: AC
  Administered 2023-02-27: 80 mg via EPIDURAL

## 2023-02-27 NOTE — Discharge Instructions (Signed)

## 2023-02-28 ENCOUNTER — Ambulatory Visit: Payer: BC Managed Care – PPO | Admitting: Internal Medicine

## 2023-03-03 DIAGNOSIS — R7309 Other abnormal glucose: Secondary | ICD-10-CM | POA: Diagnosis not present

## 2023-03-04 ENCOUNTER — Telehealth: Payer: Self-pay | Admitting: Orthopedic Surgery

## 2023-03-04 NOTE — Telephone Encounter (Signed)
Patient called in requesting a call back in regards to his back pain. Had injection with [facility] last week, and states the injection has not helped quite like the one Dr. Alvester Morin gave.  Wants to know what to expect; he is going out of town for 2 weeks on Thursday.

## 2023-03-05 ENCOUNTER — Telehealth: Payer: Self-pay | Admitting: Orthopedic Surgery

## 2023-03-05 ENCOUNTER — Telehealth: Payer: Self-pay | Admitting: Family Medicine

## 2023-03-05 DIAGNOSIS — M5441 Lumbago with sciatica, right side: Secondary | ICD-10-CM

## 2023-03-05 DIAGNOSIS — R200 Anesthesia of skin: Secondary | ICD-10-CM

## 2023-03-05 MED ORDER — METHYLPREDNISOLONE 4 MG PO TBPK: ORAL_TABLET | ORAL | 0 refills | Status: DC

## 2023-03-05 MED ORDER — TRAMADOL HCL 50 MG PO TABS: 50.0000 mg | ORAL_TABLET | Freq: Four times a day (QID) | ORAL | 0 refills | Status: AC | PRN

## 2023-03-05 NOTE — Telephone Encounter (Signed)
He is now under ortho's care. I prescribed medication in 12/2022 while waiting for lumbar MRI and appt with orthopedist. Thanks, BJ

## 2023-03-05 NOTE — Telephone Encounter (Signed)
Dr. Christell Constant upped patient's rx to 2 times daily per phone note today, will route med refill to him.

## 2023-03-05 NOTE — Telephone Encounter (Signed)
Pt called again about a call from Dr Christell Constant for medical advice from last appt with injection with [facility]. Please see last note. Please call pt [phone].

## 2023-03-05 NOTE — Telephone Encounter (Signed)
Pt asking if he could take gabapentin (NEURONTIN) 300 MG capsule 12 hours apart, still having lower back pain.

## 2023-03-05 NOTE — Addendum Note (Signed)
Addended by: Kathreen Devoid on: 03/05/2023 04:36 PM   Modules accepted: Orders

## 2023-03-24 ENCOUNTER — Ambulatory Visit (INDEPENDENT_AMBULATORY_CARE_PROVIDER_SITE_OTHER): Payer: BC Managed Care – PPO | Admitting: Orthopedic Surgery

## 2023-03-24 DIAGNOSIS — M5416 Radiculopathy, lumbar region: Secondary | ICD-10-CM | POA: Diagnosis not present

## 2023-03-24 MED ORDER — PREGABALIN 75 MG PO CAPS: 75.0000 mg | ORAL_CAPSULE | Freq: Two times a day (BID) | ORAL | 0 refills | Status: DC

## 2023-03-24 NOTE — Progress Notes (Signed)
Orthopedic Spine Surgery Office Note   Assessment: Patient is a 52 y.o. male with right leg pain that goes into his lateral thigh and posterior leg. Has a right paracentral disc herniation at L4/5.     Plan: -Patient has tried tylenol, medrol dosepak, gabapentin, lumbar steroid injections -He wanted to try lyrica prior to deciding on surgery so this was prescribed today. Told him to stop gabapentin -We discussed L4/5 microdiscectomy as a treatment option for him today -Patient should return to office in 4 weeks, x-rays at next visit: none     Patient expressed understanding of the plan and all questions were answered to the patient's satisfaction.    ___________________________________________________________________________     History:   Patient is a 53 y.o. male who presents today for follow up on lumbar spine. Patient has had about six months of radiating leg pain at this point. He initially felt the pain in the latera thigh and into the posterior leg. His pain is now more in the buttock. He previously responded to an right L4/5 injection that gave him great, near complete, pain relief for 5 weeks but then pain returned. He had a second injection that did not help quite as much and only last one week. He still having paresthesias in the posterior leg. No new numbness or paresthesias. No leg side symptoms.      Treatments tried: tylenol, medrol dosepak, gabapentin, lumbar steroid injections    Physical Exam:   General: no acute distress, appears stated age Neurologic: alert, answering questions appropriately, following commands Respiratory: unlabored breathing on room air, symmetric chest rise Psychiatric: appropriate affect, normal cadence to speech     MSK (spine):   -Strength exam                                                   Left                  Right EHL                              5/5                  5/5 TA                                 5/5                   5/5 GSC                             5/5                  5/5 Knee extension            5/5                  5/5 Hip flexion                    5/5                  5/5   -Sensory exam  Sensation intact to light touch in L3-S1 nerve distributions of bilateral lower extremities    Imaging: XR of the lumbar spine from 12/23/2022 was previously independently reviewed and interpreted, showing disc height loss at L4/5 and L5/S1. No evidence of instability on flexion/extension views. No fracture or dislocation seen.    MRI of the lumbar spine from 12/20/2022 was previously independently reviewed and interpreted, showing L4/5 disc protrusion that is greater on the right side, lateral recess stenosis at L4/5. DDD at L4/5 and L5/S1.      Patient name: Daniel Juarez Patient MRN: [id number] Date of visit: .03/24/23

## 2023-04-03 DIAGNOSIS — R7309 Other abnormal glucose: Secondary | ICD-10-CM | POA: Diagnosis not present

## 2023-04-23 ENCOUNTER — Ambulatory Visit: Payer: BC Managed Care – PPO | Admitting: Orthopedic Surgery

## 2023-05-04 DIAGNOSIS — R7309 Other abnormal glucose: Secondary | ICD-10-CM | POA: Diagnosis not present

## 2023-05-09 NOTE — Progress Notes (Unsigned)
ACUTE VISIT No chief complaint on file.  HPI: [name] is a 52 y.o. male, who is here today complaining of *** HPI  Review of Systems See other pertinent positives and negatives in HPI.  Current Outpatient Medications on File Prior to Visit  Medication Sig Dispense Refill   famotidine (PEPCID) 40 MG tablet TAKE 1 TABLET BY MOUTH EVERYDAY AT BEDTIME 90 tablet 1   levothyroxine (SYNTHROID) 150 MCG tablet Take 1 tablet (150 mcg total) by mouth daily. 90 tablet 3   linaclotide (LINZESS) 145 MCG CAPS capsule Take 1 capsule (145 mcg total) by mouth daily before breakfast. 30 capsule 1   methylPREDNISolone (MEDROL DOSEPAK) 4 MG TBPK tablet Take as prescribed on the box 21 tablet 0   Multiple Vitamin (MULTIVITAMIN PO) Take by mouth daily.     omeprazole (PRILOSEC) 40 MG capsule Take 40 mg by mouth 2 (two) times daily.     pregabalin (LYRICA) 75 MG capsule Take 1 capsule (75 mg total) by mouth 2 (two) times daily. 60 capsule 0   tamsulosin (FLOMAX) 0.4 MG CAPS capsule Take 0.4 mg by mouth daily.     Vitamin D, Ergocalciferol, (DRISDOL) 1.25 MG (50000 UNIT) CAPS capsule TAKE 1 CAPSULE BY MOUTH ONCE EVERY 3 WEEKS 4 capsule 3   No current facility-administered medications on file prior to visit.    Past Medical History:  Diagnosis Date   Allergy    Arthritis    Chicken pox    Hyperbilirubinemia    Rosacea, unspecified    Follows with dermatologists.   Thyroid disease    hypo   No Known Allergies  Social History   Socioeconomic History   Marital status: Married    Spouse name: Not on file   Number of children: 3   Years of education: Not on file   Highest education level: Master's degree (e.g., MA, MS, MEng, MEd, MSW, MBA)  Occupational History   Not on file  Tobacco Use   Smoking status: Never   Smokeless tobacco: Never  Vaping Use   Vaping status: Never Used  Substance and Sexual Activity   Alcohol use: Yes    Comment: occasionally   Drug use: No    Sexual activity: Yes    Partners: Female  Other Topics Concern   Not on file  Social History Narrative   Not on file   Social Determinants of Health   Financial Resource Strain: Low Risk  (09/06/2022)   Overall Financial Resource Strain (CARDIA)    Difficulty of Paying Living Expenses: Not hard at all  Food Insecurity: No Food Insecurity (09/06/2022)   Hunger Vital Sign    Worried About Running Out of Food in the Last Year: Never true    Ran Out of Food in the Last Year: Never true  Transportation Needs: No Transportation Needs (09/06/2022)   PRAPARE - Administrator, Civil Service (Medical): No    Lack of Transportation (Non-Medical): No  Physical Activity: Insufficiently Active (09/06/2022)   Exercise Vital Sign    Days of Exercise per Week: 2 days    Minutes of Exercise per Session: 50 min  Stress: No Stress Concern Present (09/06/2022)   [employer] of Occupational Health - Occupational Stress Questionnaire    Feeling of Stress : Only a little  Social Connections: Moderately Integrated (09/06/2022)   Social Connection and Isolation Panel [NHANES]    Frequency of Communication with Friends and Family: Once a week    Frequency  of Social Gatherings with Friends and Family: More than three times a week    Attends Religious Services: More than 4 times per year    Active Member of [employer] or Organizations: No    Attends Engineer, structural: Not on file    Marital Status: Married    There were no vitals filed for this visit. There is no height or weight on file to calculate BMI.  Physical Exam  ASSESSMENT AND PLAN: There are no diagnoses linked to this encounter.  No follow-ups on file.  Kajsa Butrum G. [place], MD  [facility]. [place] office.  Discharge Instructions   None

## 2023-05-12 ENCOUNTER — Ambulatory Visit (INDEPENDENT_AMBULATORY_CARE_PROVIDER_SITE_OTHER): Payer: BC Managed Care – PPO | Admitting: Family Medicine

## 2023-05-12 ENCOUNTER — Encounter: Payer: Self-pay | Admitting: Family Medicine

## 2023-05-12 VITALS — BP 120/60 | HR 70 | Temp 97.7°F | Resp 12 | Ht 71.0 in | Wt 234.5 lb

## 2023-05-12 DIAGNOSIS — E559 Vitamin D deficiency, unspecified: Secondary | ICD-10-CM

## 2023-05-12 DIAGNOSIS — R5383 Other fatigue: Secondary | ICD-10-CM | POA: Diagnosis not present

## 2023-05-12 DIAGNOSIS — E039 Hypothyroidism, unspecified: Secondary | ICD-10-CM

## 2023-05-12 DIAGNOSIS — R413 Other amnesia: Secondary | ICD-10-CM | POA: Diagnosis not present

## 2023-05-12 DIAGNOSIS — R519 Headache, unspecified: Secondary | ICD-10-CM | POA: Diagnosis not present

## 2023-05-12 LAB — TSH: TSH: 0.38 u[IU]/mL (ref 0.35–5.50)

## 2023-05-12 LAB — CBC
HCT: 45.4 % (ref 39.0–52.0)
Hemoglobin: 14.8 g/dL (ref 13.0–17.0)
MCHC: 32.6 g/dL (ref 30.0–36.0)
MCV: 94.4 fl (ref 78.0–100.0)
Platelets: 238 10*3/uL (ref 150.0–400.0)
RBC: 4.81 Mil/uL (ref 4.22–5.81)
RDW: 13.4 % (ref 11.5–15.5)
WBC: 8.4 10*3/uL (ref 4.0–10.5)

## 2023-05-12 LAB — VITAMIN D 25 HYDROXY (VIT D DEFICIENCY, FRACTURES): VITD: 31.36 ng/mL (ref 30.00–100.00)

## 2023-05-12 LAB — VITAMIN B12: Vitamin B-12: 276 pg/mL (ref 211–911)

## 2023-05-12 NOTE — Patient Instructions (Addendum)
A few things to remember from today's visit:  Hypothyroidism, unspecified type - Plan: TSH  Vitamin D insufficiency - Plan: VITAMIN D 25 Hydroxy (Vit-D Deficiency, Fractures)  Memory difficulties - Plan: CBC, Vitamin B12  Headache, unspecified headache type  Do not use My Chart to request refills or for acute issues that need immediate attention. If you send a my chart message, it may take a few days to be addressed, specially if I am not in the office.  Please be sure medication list is accurate. If a new problem present, please set up appointment sooner than planned today.

## 2023-05-12 NOTE — Assessment & Plan Note (Signed)
Continue current dose of vit D supplementation. Follows with endocrinologies. Further recommendations according to 25 OH vit D result.

## 2023-05-12 NOTE — Assessment & Plan Note (Signed)
Follows with endocrinologist. Last TSH1.8 in 08/2022. Requesting lab.

## 2023-05-14 ENCOUNTER — Encounter: Payer: Self-pay | Admitting: Internal Medicine

## 2023-05-14 ENCOUNTER — Ambulatory Visit: Payer: BC Managed Care – PPO | Admitting: Orthopedic Surgery

## 2023-05-14 ENCOUNTER — Ambulatory Visit (INDEPENDENT_AMBULATORY_CARE_PROVIDER_SITE_OTHER): Payer: BC Managed Care – PPO | Admitting: Internal Medicine

## 2023-05-14 VITALS — BP 122/78 | HR 70 | Ht 71.0 in | Wt 236.0 lb

## 2023-05-14 DIAGNOSIS — E039 Hypothyroidism, unspecified: Secondary | ICD-10-CM

## 2023-05-14 DIAGNOSIS — E559 Vitamin D deficiency, unspecified: Secondary | ICD-10-CM

## 2023-05-14 DIAGNOSIS — R5383 Other fatigue: Secondary | ICD-10-CM | POA: Diagnosis not present

## 2023-05-14 DIAGNOSIS — R7303 Prediabetes: Secondary | ICD-10-CM

## 2023-05-14 LAB — POCT GLYCOSYLATED HEMOGLOBIN (HGB A1C): Hemoglobin A1C: 5.7 % — AB (ref 4.0–5.6)

## 2023-05-14 LAB — LUTEINIZING HORMONE: LH: 3.94 m[IU]/mL (ref 1.50–9.30)

## 2023-05-14 NOTE — Patient Instructions (Signed)
Start over the counter B-complex Take Ergocalciferol ( Vitamin D) 50,000 international unit   every 2 weeks  Take Levothyroxine 150 mcg , half a tablet on Sundays and 1 tablet the rest of the week

## 2023-05-14 NOTE — Progress Notes (Unsigned)
Name: Daniel Juarez  MRN/ DOB: [id number], [birth year]    Age/ Sex: 52 y.o., male     PCP: [place], Betty G, MD   Reason for Endocrinology Evaluation: Hypothyroidism     Initial Endocrinology Clinic Visit: 08/09/2020    PATIENT IDENTIFIER: [name] is a 52 y.o., male with a past medical history of hypothyroidism, environmental allergies and Vitamin D deficiency. He has followed with [facility] since 08/09/2020 for consultative assistance with management of his hypothyroidism.   HISTORICAL SUMMARY:   He has been diagnosed with hypothyroidism in 2008. Baseline thyroid levels not available. He has been on LT-4 replacement since then.      In 2012 he was diagnosed with Vitamin B12 and D deficiency   In  Review of his records he was seen by Dr. Leslie Dales in 2018, per notes he was evaluated in 2014 for cognitive symptoms and fatigue . There was some suspicion of depression and multiple situation stresses at the time,  as all his labs were normal at the time.     He was having issues with anxiety, confusion, loose stools that he attributed to low normal TSH level, we reduced his levothyroxine from 175 to 150 mcg daily   He had concerns about feet stress fractures, but his Alk. Phos, Calcium, vitamin D , PTH , Mg and phos as well as DXA scan were all normal.     SUBJECTIVE:     Today (05/14/2023):  Daniel Juarez is here for a follow up on hypothyroidism   He was evaluated by his PCP for fatigue and brain fogginess, labs unrevealing  Has not been feeling well rested and forgetfulness with slow articulation especially at work , has been irritable and more sad or depressed  Weight has been decreasing  MVI bother stomach  Continue with constipation  Denies local neck swelling  Has occasional  palpitations and anxiety  No snoring  Denies erectile dysfunction Denies galactorrhea     Levothyroxine 150 mcg daily  Vitamin D 50,000 EVERY 3 weeks  Vitamin  D 1000 iu daily e    HISTORY:  Past Medical History:  Past Medical History:  Diagnosis Date   Allergy    Arthritis    Chicken pox    Hyperbilirubinemia    Rosacea, unspecified    Follows with dermatologists.   Thyroid disease    hypo   Past Surgical History:  Past Surgical History:  Procedure Laterality Date   ANTERIOR CERVICAL DECOMP/DISCECTOMY FUSION     COLONOSCOPY     TONSILLECTOMY     VASECTOMY     Social History:  reports that he has never smoked. He has never used smokeless tobacco. He reports current alcohol use. He reports that he does not use drugs. Family History:  Family History  Problem Relation Age of Onset   Diabetes Mother    Hypertension Mother    Atrial fibrillation Mother    Multiple sclerosis Father    Cerebral palsy Sister    Atrial fibrillation Brother    Colon cancer Neg Hx    Esophageal cancer Neg Hx    Rectal cancer Neg Hx    Stomach cancer Neg Hx      HOME MEDICATIONS: Allergies as of 05/14/2023   No Known Allergies      Medication List        Accurate as of May 14, 2023  8:22 AM. If you have any questions, ask your nurse or doctor.  STOP taking these medications    linaclotide 145 MCG Caps capsule Commonly known as: LINZESS Stopped by: Johnney Ou Araly Kaas   methylPREDNISolone 4 MG Tbpk tablet Commonly known as: MEDROL DOSEPAK Stopped by: Johnney Ou Adib Wahba   MULTIVITAMIN PO Stopped by: Johnney Ou Romie Keeble       TAKE these medications    famotidine 40 MG tablet Commonly known as: PEPCID TAKE 1 TABLET BY MOUTH EVERYDAY AT BEDTIME   levothyroxine 150 MCG tablet Commonly known as: SYNTHROID Take 1 tablet (150 mcg total) by mouth daily.   omeprazole 40 MG capsule Commonly known as: PRILOSEC Take 40 mg by mouth 2 (two) times daily.   pregabalin 75 MG capsule Commonly known as: LYRICA Take 1 capsule (75 mg total) by mouth 2 (two) times daily.   tamsulosin 0.4 MG Caps capsule Commonly  known as: FLOMAX Take 0.4 mg by mouth daily.   Vitamin D (Ergocalciferol) 1.25 MG (50000 UNIT) Caps capsule Commonly known as: DRISDOL TAKE 1 CAPSULE BY MOUTH ONCE EVERY 3 WEEKS          OBJECTIVE:   PHYSICAL EXAM: VS: BP 122/78 (BP Location: Left Arm, Patient Position: Sitting, Cuff Size: Large)   Pulse 70   Ht 5\' 11"  (1.803 m)   Wt 236 lb (107 kg)   SpO2 96%   BMI 32.92 kg/m    EXAM: General: Pt appears well and is in NAD  Neck: General: Supple without adenopathy. Thyroid: Thyroid size normal.  No goiter or nodules appreciated.  Lungs: Clear with good BS bilat   Heart: Auscultation: RRR.  Abdomen: soft, nontender  Extremities:  BL LE: No pretibial edema   Mental Status: Judgment, insight: Intact Mood and affect: No depression, anxiety, or agitation     DATA REVIEWED:    Latest Reference Range & Units 05/12/23 13:20  VITD 30.00 - 100.00 ng/mL 31.36  Vitamin B12 211 - 911 pg/mL 276  WBC 4.0 - 10.5 K/uL 8.4  RBC 4.22 - 5.81 Mil/uL 4.81  Hemoglobin 13.0 - 17.0 g/dL 21.3  HCT 08.6 - 57.8 % 45.4  MCV 78.0 - 100.0 fl 94.4  MCHC 30.0 - 36.0 g/dL 46.9  RDW 62.9 - 52.8 % 13.4  Platelets 150.0 - 400.0 K/uL 238.0  TSH 0.35 - 5.50 uIU/mL 0.38  Old records , labs and images have been reviewed.    ASSESSMENT / PLAN / RECOMMENDATIONS:   1. Hypothyroidism:   - No local neck symptoms  - He has been taking Levothyroxine appropriately  - TSH at the lower end of normal, will decrease as below  Medications   Continue Levothyroxine 150 mcg , half a tablet on Sundays 1 tablet the rest of the week   2. Vitamin D Deficiency :   - Vitamin D normal  - Continue current regimen   3. Pre-diabetes   - A1c at  5.7 % , this has trended down from 5.9% -Praised the patient on weight loss, and to continue lifestyle changes  4. Constipation :  -Patient encouraged to take stool softeners in addition to already fiber intake -He takes MiraLAX once weekly, patient may  increase this to twice weekly if needed  5. Fatigue   -Differential diagnosis includes OSA, ADHD, depression/anxiety -Testosterone pending today    F/U in 6 months    Signed electronically by: Lyndle Herrlich, MD  [facility]  [facility] [address] [place] [place], [place] [address] Phone: [phone] FAX: [phone]  CC: [place], Betty G, MD [address] [place] [address] Phone: [phone]  Fax: [phone]   Return to Endocrinology clinic as below: Future Appointments  Date Time Provider Department Center  05/21/2023  2:45 PM London Sheer, MD [facility] None

## 2023-05-17 LAB — TESTOSTERONE, TOTAL, LC/MS/MS: Testosterone, Total, LC-MS-MS: 424 ng/dL (ref 250–1100)

## 2023-05-17 LAB — PROLACTIN: Prolactin: 5.2 ng/mL (ref 2.0–18.0)

## 2023-05-21 ENCOUNTER — Ambulatory Visit: Payer: BC Managed Care – PPO | Admitting: Orthopedic Surgery

## 2023-06-03 DIAGNOSIS — R7309 Other abnormal glucose: Secondary | ICD-10-CM | POA: Diagnosis not present

## 2023-06-15 ENCOUNTER — Other Ambulatory Visit: Payer: Self-pay | Admitting: Family Medicine

## 2023-06-15 DIAGNOSIS — N401 Enlarged prostate with lower urinary tract symptoms: Secondary | ICD-10-CM

## 2023-06-18 ENCOUNTER — Other Ambulatory Visit: Payer: Self-pay | Admitting: Family Medicine

## 2023-06-19 ENCOUNTER — Ambulatory Visit: Payer: BC Managed Care – PPO | Admitting: Orthopedic Surgery

## 2023-06-25 ENCOUNTER — Encounter: Payer: BC Managed Care – PPO | Admitting: Family Medicine

## 2023-06-30 ENCOUNTER — Ambulatory Visit (INDEPENDENT_AMBULATORY_CARE_PROVIDER_SITE_OTHER): Payer: BC Managed Care – PPO | Admitting: Family Medicine

## 2023-06-30 ENCOUNTER — Encounter: Payer: Self-pay | Admitting: Family Medicine

## 2023-06-30 VITALS — BP 120/70 | HR 65 | Temp 98.0°F | Resp 12 | Ht 71.0 in | Wt 239.0 lb

## 2023-06-30 DIAGNOSIS — Z0001 Encounter for general adult medical examination with abnormal findings: Secondary | ICD-10-CM | POA: Diagnosis not present

## 2023-06-30 DIAGNOSIS — N401 Enlarged prostate with lower urinary tract symptoms: Secondary | ICD-10-CM | POA: Diagnosis not present

## 2023-06-30 DIAGNOSIS — R350 Frequency of micturition: Secondary | ICD-10-CM | POA: Diagnosis not present

## 2023-06-30 DIAGNOSIS — Z0000 Encounter for general adult medical examination without abnormal findings: Secondary | ICD-10-CM | POA: Insufficient documentation

## 2023-06-30 MED ORDER — TAMSULOSIN HCL 0.4 MG PO CAPS
0.4000 mg | ORAL_CAPSULE | Freq: Every day | ORAL | 3 refills | Status: AC
Start: 2023-06-30 — End: ?

## 2023-06-30 NOTE — Assessment & Plan Note (Signed)
He feels like Flomax 0.4 mg daily has helped. He is not longer seeing urologist. New Rx for Flomax sent.

## 2023-06-30 NOTE — Patient Instructions (Addendum)
A few things to remember from today's visit:  Routine general medical examination at a health care facility  Screening for lipoid disorders  If you need refills for medications you take chronically, please call your pharmacy. Do not use My Chart to request refills or for acute issues that need immediate attention. If you send a my chart message, it may take a few days to be addressed, specially if I am not in the office.  Please be sure medication list is accurate. If a new problem present, please set up appointment sooner than planned today.  Health Maintenance, Male Adopting a healthy lifestyle and getting preventive care are important in promoting health and wellness. Ask your health care provider about: The right schedule for you to have regular tests and exams. Things you can do on your own to prevent diseases and keep yourself healthy. What should I know about diet, weight, and exercise? Eat a healthy diet  Eat a diet that includes plenty of vegetables, fruits, low-fat dairy products, and lean protein. Do not eat a lot of foods that are high in solid fats, added sugars, or sodium. Maintain a healthy weight Body mass index (BMI) is a measurement that can be used to identify possible weight problems. It estimates body fat based on height and weight. Your health care provider can help determine your BMI and help you achieve or maintain a healthy weight. Get regular exercise Get regular exercise. This is one of the most important things you can do for your health. Most adults should: Exercise for at least 150 minutes each week. The exercise should increase your heart rate and make you sweat (moderate-intensity exercise). Do strengthening exercises at least twice a week. This is in addition to the moderate-intensity exercise. Spend less time sitting. Even light physical activity can be beneficial. Watch cholesterol and blood lipids Have your blood tested for lipids and cholesterol at  54 years of age, then have this test every 5 years. You may need to have your cholesterol levels checked more often if: Your lipid or cholesterol levels are high. You are older than 54 years of age. You are at high risk for heart disease. What should I know about cancer screening? Many types of cancers can be detected early and may often be prevented. Depending on your health history and family history, you may need to have cancer screening at various ages. This may include screening for: Colorectal cancer. Prostate cancer. Skin cancer. Lung cancer. What should I know about heart disease, diabetes, and high blood pressure? Blood pressure and heart disease High blood pressure causes heart disease and increases the risk of stroke. This is more likely to develop in people who have high blood pressure readings or are overweight. Talk with your health care provider about your target blood pressure readings. Have your blood pressure checked: Every 3-5 years if you are 59-59 years of age. Every year if you are 18 years old or older. If you are between the ages of 28 and 77 and are a current or former smoker, ask your health care provider if you should have a one-time screening for abdominal aortic aneurysm (AAA). Diabetes Have regular diabetes screenings. This checks your fasting blood sugar level. Have the screening done: Once every three years after age 54 if you are at a normal weight and have a low risk for diabetes. More often and at a younger age if you are overweight or have a high risk for diabetes. What should I know  about preventing infection? Hepatitis B If you have a higher risk for hepatitis B, you should be screened for this virus. Talk with your health care provider to find out if you are at risk for hepatitis B infection. Hepatitis C Blood testing is recommended for: Everyone born from [birth year] through 1967. Anyone with known risk factors for hepatitis C. Sexually transmitted  infections (STIs) You should be screened each year for STIs, including gonorrhea and chlamydia, if: You are sexually active and are younger than 54 years of age. You are older than 54 years of age and your health care provider tells you that you are at risk for this type of infection. Your sexual activity has changed since you were last screened, and you are at increased risk for chlamydia or gonorrhea. Ask your health care provider if you are at risk. Ask your health care provider about whether you are at high risk for HIV. Your health care provider may recommend a prescription medicine to help prevent HIV infection. If you choose to take medicine to prevent HIV, you should first get tested for HIV. You should then be tested every 3 months for as long as you are taking the medicine. Follow these instructions at home: Alcohol use Do not drink alcohol if your health care provider tells you not to drink. If you drink alcohol: Limit how much you have to 0-2 drinks a day. Know how much alcohol is in your drink. In the [place] one drink equals one 12 oz bottle of beer (355 mL), one 5 oz glass of wine (148 mL), or one 1 oz glass of hard liquor (44 mL). Lifestyle Do not use any products that contain nicotine or tobacco. These products include cigarettes, chewing tobacco, and vaping devices, such as e-cigarettes. If you need help quitting, ask your health care provider. Do not use street drugs. Do not share needles. Ask your health care provider for help if you need support or information about quitting drugs. General instructions Schedule regular health, dental, and eye exams. Stay current with your vaccines. Tell your health care provider if: You often feel depressed. You have ever been abused or do not feel safe at home. Summary Adopting a healthy lifestyle and getting preventive care are important in promoting health and wellness. Follow your health care provider's instructions about healthy  diet, exercising, and getting tested or screened for diseases. Follow your health care provider's instructions on monitoring your cholesterol and blood pressure. This information is not intended to replace advice given to you by your health care provider. Make sure you discuss any questions you have with your health care provider. Document Revised: 01/08/2021 Document Reviewed: 01/08/2021 Elsevier Patient Education  2024 [employer].

## 2023-06-30 NOTE — Progress Notes (Signed)
HPI: [name] is a 52 y.o.male with a PMHx significant for GERD, hypothyroidism, benign prostatic hyperplasia with urinary frequency, prediabetes, vitamin D deficiency, and low back pain, who is here today for his routine physical examination.  Last CPE: 08/01/2021  Exercise: He says he is active at home.  Diet: He states he eats healthy. He mainly cooks at home and eats vegetables most days.  Sleep: He reports he sleeps 7-8 hours per night.  Alcohol Use: He says he rarely drinks, approximately once every 3-4 months.  Smoking: never Vision: UTD on routine vision care. Dental: UTD on routine dental care. He sees them twice per year.   Immunization History  Administered Date(s) Administered   Influenza Inj Mdck Quad Pf 05/20/2023   Influenza,inj,Quad PF,6+ Mos 05/23/2021   Influenza-Unspecified 06/04/2017, 06/10/2018, 06/27/2019, 06/29/2020, 05/21/2022   PFIZER Comirnaty(Gray Top)Covid-19 Tri-Sucrose Vaccine 02/07/2021   PFIZER(Purple Top)SARS-COV-2 Vaccination 11/26/2019, 12/15/2019, 08/07/2020   Tdap 10/13/2017, 08/06/2021   Zoster Recombinant(Shingrix) 03/09/2021, 09/24/2021   Health Maintenance  Topic Date Due   COVID-19 Vaccine (5 - 2023-24 season) 05/04/2023   HIV Screening  11/03/2024 (Originally 04/04/1986)   Colonoscopy  10/03/2026   DTaP/Tdap/Td (3 - Td or Tdap) 08/07/2031   INFLUENZA VACCINE  Completed   Hepatitis C Screening  Completed   Zoster Vaccines- Shingrix  Completed   HPV VACCINES  Aged Out   Last prostate ca screening:  BPH:He has not seen a urologist since his last visit.  He still takes Flomax 0.4 mg daily, which he feels like it helps with urinary frequency. Lab Results  Component Value Date   PSA 0.73 11/13/2022   PSA 0.64 01/18/2022   PSA 0.72 08/01/2021   Chronic medical problems:   Hypothyroidism:  He is currently taking levothyroxine 150 mcg daily.  He sees his endocrinologist every six months.   GERD:  He still takes  famotidine 40 mg daily and omeprazole 40 mg bid.  He says he still has heartburn if he doesn't take his medications.   Vitamin D/Vitamin B12 deficiency:  He is currently taking Vitamin D and vitamin B12 supplementation. States that these have helped with fatigue.  He mentions he is seeing ortho in a couple days. He is still having lower back pain, occasionally radiated to LE's. Epidural injections have helped temporarily.  Concerns today: none  Review of Systems  Constitutional:  Negative for activity change, appetite change and fever.  HENT:  Negative for dental problem, nosebleeds, sore throat and trouble swallowing.   Eyes:  Negative for redness and visual disturbance.  Respiratory:  Negative for apnea, cough, shortness of breath and wheezing.   Cardiovascular:  Negative for chest pain, palpitations and leg swelling.  Gastrointestinal:  Negative for abdominal pain, blood in stool, nausea and vomiting.  Endocrine: Negative for cold intolerance, heat intolerance, polydipsia, polyphagia and polyuria.  Genitourinary:  Negative for decreased urine volume, dysuria, genital sores, hematuria and testicular pain.  Musculoskeletal:  Positive for arthralgias and back pain. Negative for joint swelling and myalgias.  Skin:  Negative for color change and rash.  Allergic/Immunologic: Positive for environmental allergies.  Neurological:  Negative for syncope, weakness and headaches.  Hematological:  Negative for adenopathy. Does not bruise/bleed easily.  Psychiatric/Behavioral:  Negative for confusion and sleep disturbance.   All other systems reviewed and are negative.  Current Outpatient Medications on File Prior to Visit  Medication Sig Dispense Refill   famotidine (PEPCID) 40 MG tablet TAKE 1 TABLET BY MOUTH EVERYDAY AT BEDTIME  90 tablet 1   levothyroxine (SYNTHROID) 150 MCG tablet Take 1 tablet (150 mcg total) by mouth daily. 90 tablet 3   omeprazole (PRILOSEC) 40 MG capsule Take 40 mg by  mouth 2 (two) times daily.     Vitamin D, Ergocalciferol, (DRISDOL) 1.25 MG (50000 UNIT) CAPS capsule TAKE 1 CAPSULE BY MOUTH ONCE EVERY 3 WEEKS 4 capsule 3   pregabalin (LYRICA) 75 MG capsule Take 1 capsule (75 mg total) by mouth 2 (two) times daily. 60 capsule 0   No current facility-administered medications on file prior to visit.   Past Medical History:  Diagnosis Date   Allergy    Arthritis    Chicken pox    Hyperbilirubinemia    Rosacea, unspecified    Follows with dermatologists.   Thyroid disease    hypo   Past Surgical History:  Procedure Laterality Date   ANTERIOR CERVICAL DECOMP/DISCECTOMY FUSION     COLONOSCOPY     TONSILLECTOMY     VASECTOMY      No Known Allergies  Family History  Problem Relation Age of Onset   Diabetes Mother    Hypertension Mother    Atrial fibrillation Mother    Multiple sclerosis Father    Cerebral palsy Sister    Atrial fibrillation Brother    Colon cancer Neg Hx    Esophageal cancer Neg Hx    Rectal cancer Neg Hx    Stomach cancer Neg Hx     Social History   Socioeconomic History   Marital status: Married    Spouse name: Not on file   Number of children: 3   Years of education: Not on file   Highest education level: Master's degree (e.g., MA, MS, MEng, MEd, MSW, MBA)  Occupational History   Not on file  Tobacco Use   Smoking status: Never   Smokeless tobacco: Never  Vaping Use   Vaping status: Never Used  Substance and Sexual Activity   Alcohol use: Yes    Comment: occasionally   Drug use: No   Sexual activity: Yes    Partners: Female  Other Topics Concern   Not on file  Social History Narrative   Not on file   Social Determinants of Health   Financial Resource Strain: Low Risk  (06/29/2023)   Overall Financial Resource Strain (CARDIA)    Difficulty of Paying Living Expenses: Not hard at all  Food Insecurity: No Food Insecurity (06/29/2023)   Hunger Vital Sign    Worried About Running Out of Food in the  Last Year: Never true    Ran Out of Food in the Last Year: Never true  Transportation Needs: No Transportation Needs (06/29/2023)   PRAPARE - Administrator, Civil Service (Medical): No    Lack of Transportation (Non-Medical): No  Physical Activity: Sufficiently Active (06/29/2023)   Exercise Vital Sign    Days of Exercise per Week: 2 days    Minutes of Exercise per Session: 120 min  Stress: No Stress Concern Present (06/29/2023)   Daniel Juarez    Feeling of Stress : Not at all  Social Connections: Socially Integrated (06/29/2023)   Social Connection and Isolation Panel [NHANES]    Frequency of Communication with Friends and Family: More than three times a week    Frequency of Social Gatherings with Friends and Family: More than three times a week    Attends Religious Services: More than 4 times per year  Active Member of Clubs or Organizations: Yes    Attends Banker Meetings: More than 4 times per year    Marital Status: Married   Vitals:   06/30/23 1454  BP: 120/70  Pulse: 65  Resp: 12  Temp: 98 F (36.7 C)  SpO2: 97%   Body mass index is 33.33 kg/m.  Wt Readings from Last 3 Encounters:  06/30/23 239 lb (108.4 kg)  05/14/23 236 lb (107 kg)  05/12/23 234 lb 8 oz (106.4 kg)   Physical Exam Vitals and nursing note reviewed.  Constitutional:      General: He is not in acute distress.    Appearance: He is well-developed.  HENT:     Head: Normocephalic and atraumatic.     Right Ear: Tympanic membrane, ear canal and external ear normal.     Left Ear: Tympanic membrane, ear canal and external ear normal.     Mouth/Throat:     Mouth: Mucous membranes are moist.  Eyes:     Extraocular Movements: Extraocular movements intact.     Conjunctiva/sclera: Conjunctivae normal.     Pupils: Pupils are equal, round, and reactive to light.  Neck:     Thyroid: No thyroid mass.   Cardiovascular:     Rate and Rhythm: Normal rate and regular rhythm.     Pulses:          Dorsalis pedis pulses are 2+ on the right side and 2+ on the left side.     Heart sounds: No murmur heard. Pulmonary:     Effort: Pulmonary effort is normal. No respiratory distress.     Breath sounds: Normal breath sounds.  Abdominal:     Palpations: Abdomen is soft. There is no hepatomegaly or mass.     Tenderness: There is no abdominal tenderness.  Genitourinary:    Comments: No concerns. Musculoskeletal:        General: No tenderness.     Cervical back: Normal range of motion.     Right lower leg: No edema.     Left lower leg: No edema.     Comments: No major deformities appreciated and no signs of synovitis.  Lymphadenopathy:     Cervical: No cervical adenopathy.     Upper Body:     Right upper body: No supraclavicular adenopathy.     Left upper body: No supraclavicular adenopathy.  Skin:    General: Skin is warm.     Findings: No erythema.  Neurological:     General: No focal deficit present.     Mental Status: He is alert and oriented to person, place, and time.     Cranial Nerves: No cranial nerve deficit.     Sensory: No sensory deficit.     Gait: Gait normal.     Deep Tendon Reflexes:     Reflex Scores:      Bicep reflexes are 2+ on the right side and 2+ on the left side.      Patellar reflexes are 2+ on the right side and 2+ on the left side. Psychiatric:        Mood and Affect: Mood and affect normal.   ASSESSMENT AND PLAN:  Mr. Mensing was seen today for his routine general medical examination.   Routine general medical examination at a health care facility Assessment & Plan: We discussed the importance of regular physical activity and healthy diet for prevention of chronic illness and/or complications. Preventive guidelines reviewed. Vaccination up to date. Next CPE  in a year.   Benign prostatic hyperplasia with urinary frequency Assessment & Plan: He feels  like Flomax 0.4 mg daily has helped. He is not longer seeing urologist. New Rx for Flomax sent.  Orders: -     Tamsulosin HCl; Take 1 capsule (0.4 mg total) by mouth daily.  Dispense: 90 capsule; Refill: 3  Return in 1 year (on 06/29/2024) for CPE.  I, Rolla Etienne Wierda, acting as a scribe for [employer] [place], MD., have documented all relevant documentation on the behalf of Daniel Kreiser [place], MD, as directed by  Daniel Blankenhorn [place], MD while in the presence of Daniel Hallett [place], MD.   I, Woodrow Dulski [place], MD, have reviewed all documentation for this visit. The documentation on 06/30/23 for the exam, diagnosis, procedures, and orders are all accurate and complete.  Rhythm Gubbels G. [place], MD  [facility]. [place] office.

## 2023-06-30 NOTE — Assessment & Plan Note (Signed)
We discussed the importance of regular physical activity and healthy diet for prevention of chronic illness and/or complications. Preventive guidelines reviewed. Vaccination up to date. Next CPE in a year. 

## 2023-07-03 ENCOUNTER — Ambulatory Visit (INDEPENDENT_AMBULATORY_CARE_PROVIDER_SITE_OTHER): Payer: BC Managed Care – PPO | Admitting: Orthopedic Surgery

## 2023-07-03 DIAGNOSIS — G8929 Other chronic pain: Secondary | ICD-10-CM

## 2023-07-03 DIAGNOSIS — M545 Low back pain, unspecified: Secondary | ICD-10-CM | POA: Diagnosis not present

## 2023-07-03 NOTE — Progress Notes (Signed)
Orthopedic Spine Surgery Office Note   Assessment: Patient is a 52 y.o. male with right leg pain that goes into his lateral thigh and posterior leg. Leg pain has resolved but still has lower back pain. Has DDD at L5/S1 and L4/5. There is facet arthropathy at L4/5. Has a right paracentral disc herniation at L4/5.     Plan: -Patient has tried tylenol, medrol dosepak, gabapentin, lumbar steroid injections -Patient is not having any more radiating leg pain.  He still has some back pain.  He was asking about the surgery I had Dr. Previously.  I told him that surgery predictably relieve his leg pain but does not predictably relieve back pain.  After this conversation, patient did not want to proceed with surgery -In terms of his low back pain, I told him he could take tylenol 1000mg  up to three times per day. He can use Aleve in addition to the tylenol for bad days -If his back pain gets worse or persist, could consider facet injections in the future -Should his leg pain return, we could discuss right-sided L4/5 hemilaminotomy and microdiscectomy as an option -Patient should return to office on an as needed basis     Patient expressed understanding of the plan and all questions were answered to the patient's satisfaction.    ___________________________________________________________________________     History:   Patient is a 52 y.o. male who presents today for follow up on lumbar spine.  Since patient was last seen, his leg pain has resolved.  He still has some low back pain.  He feels it across the lower lumbar spine.  He feels it around L4/5 and into the paraspinal muscles around that level.  He is not having any pain radiating to either lower extremity.  He notes that the back pain is worse if he works out or is doing more heavy activity around the house.  He does not have any pain at rest.  He is not sure if the pain is worse with flexion or extension activities.     Treatments tried:  tylenol, medrol dosepak, gabapentin, lumbar steroid injections     Physical Exam:   General: no acute distress, appears stated age Neurologic: alert, answering questions appropriately, following commands Respiratory: unlabored breathing on room air, symmetric chest rise Psychiatric: appropriate affect, normal cadence to speech     MSK (spine):   -Strength exam                                                   Left                  Right EHL                              5/5                  5/5 TA                                 5/5                  5/5 GSC  5/5                  5/5 Knee extension            5/5                  5/5 Hip flexion                    5/5                  5/5   -Sensory exam                           Sensation intact to light touch in L3-S1 nerve distributions of bilateral lower extremities     Imaging: XR of the lumbar spine from 12/23/2022 was previously independently reviewed and interpreted, showing disc height loss at L4/5 and L5/S1. No evidence of instability on flexion/extension views. No fracture or dislocation seen.    MRI of the lumbar spine from 12/20/2022 was previously independently reviewed and interpreted, showing L4/5 disc protrusion that is greater on the right side, lateral recess stenosis at L4/5. DDD at L4/5 and L5/S1.      Patient name: Trina Winnie Patient MRN: [id number] Date of visit: 07/03/23

## 2023-07-04 DIAGNOSIS — R7309 Other abnormal glucose: Secondary | ICD-10-CM | POA: Diagnosis not present

## 2023-07-23 ENCOUNTER — Other Ambulatory Visit: Payer: Self-pay | Admitting: Gastroenterology

## 2023-07-29 ENCOUNTER — Encounter (INDEPENDENT_AMBULATORY_CARE_PROVIDER_SITE_OTHER): Payer: Self-pay

## 2023-08-03 DIAGNOSIS — R7309 Other abnormal glucose: Secondary | ICD-10-CM | POA: Diagnosis not present

## 2023-08-11 ENCOUNTER — Other Ambulatory Visit: Payer: Self-pay | Admitting: Nurse Practitioner

## 2023-08-20 ENCOUNTER — Other Ambulatory Visit: Payer: Self-pay | Admitting: Internal Medicine

## 2023-08-20 DIAGNOSIS — E559 Vitamin D deficiency, unspecified: Secondary | ICD-10-CM

## 2023-09-04 ENCOUNTER — Other Ambulatory Visit: Payer: Self-pay | Admitting: Medical Genetics

## 2023-09-17 ENCOUNTER — Ambulatory Visit (INDEPENDENT_AMBULATORY_CARE_PROVIDER_SITE_OTHER): Payer: BC Managed Care – PPO | Admitting: Family Medicine

## 2023-09-17 ENCOUNTER — Encounter: Payer: Self-pay | Admitting: Family Medicine

## 2023-09-17 VITALS — BP 106/70 | HR 75 | Temp 98.6°F | Wt 235.0 lb

## 2023-09-17 DIAGNOSIS — K5900 Constipation, unspecified: Secondary | ICD-10-CM | POA: Diagnosis not present

## 2023-09-17 DIAGNOSIS — R1031 Right lower quadrant pain: Secondary | ICD-10-CM | POA: Diagnosis not present

## 2023-09-17 LAB — POC URINALSYSI DIPSTICK (AUTOMATED)
Bilirubin, UA: NEGATIVE
Blood, UA: NEGATIVE
Glucose, UA: NEGATIVE
Ketones, UA: NEGATIVE
Leukocytes, UA: NEGATIVE
Nitrite, UA: NEGATIVE
Protein, UA: NEGATIVE
Spec Grav, UA: <=1.005 — AB (ref 1.010–1.025)
Urobilinogen, UA: 0.2 U/dL
pH, UA: 6 (ref 5.0–8.0)

## 2023-09-17 NOTE — Addendum Note (Signed)
 Addended by: Philbert Brave on: 09/17/2023 04:26 PM   Modules accepted: Orders

## 2023-09-17 NOTE — Progress Notes (Signed)
   Subjective:    Patient ID: Daniel Juarez, male    DOB: [birth year], 53 y.o.   MRN: [id number]  HPI Here for 2 and 1/2 weeks of intermittent pressure like pains in the central abdomen or the RLQ. He has slight nausea at times, but no vomiting. No fever. His appetite is good. No urinary symptoms. He does have frequent constipation, so he takes Miralax about one day a week. He takes Omeprazole  BID for GERD. His UA today is clear.    Review of Systems  Constitutional: Negative.   Respiratory: Negative.    Cardiovascular: Negative.   Gastrointestinal:  Positive for abdominal pain, constipation and nausea. Negative for abdominal distention, blood in stool, diarrhea and vomiting.  Genitourinary: Negative.        Objective:   Physical Exam Constitutional:      Appearance: Normal appearance. He is not ill-appearing.  Cardiovascular:     Rate and Rhythm: Normal rate and regular rhythm.     Pulses: Normal pulses.     Heart sounds: Normal heart sounds.  Pulmonary:     Effort: Pulmonary effort is normal.     Breath sounds: Normal breath sounds.  Abdominal:     General: Abdomen is flat. Bowel sounds are normal. There is no distension.     Palpations: Abdomen is soft. There is no mass.     Tenderness: There is no abdominal tenderness. There is no right CVA tenderness, left CVA tenderness, guarding or rebound.     Hernia: No hernia is present.  Neurological:     Mental Status: He is alert.           Assessment & Plan:  He has intermittent abdominal discomfort that is likely due to constipation. I advised him to use the Miralax every day. Recheck as needed.  Corita Diego, MD

## 2023-09-19 ENCOUNTER — Ambulatory Visit: Payer: BC Managed Care – PPO | Admitting: Family Medicine

## 2023-09-26 ENCOUNTER — Ambulatory Visit: Payer: BC Managed Care – PPO | Admitting: Nurse Practitioner

## 2023-10-06 ENCOUNTER — Ambulatory Visit: Payer: BC Managed Care – PPO | Admitting: Family Medicine

## 2023-10-06 ENCOUNTER — Encounter: Payer: Self-pay | Admitting: Family Medicine

## 2023-10-06 VITALS — BP 120/70 | HR 79 | Resp 12 | Ht 71.0 in | Wt 238.1 lb

## 2023-10-06 DIAGNOSIS — R300 Dysuria: Secondary | ICD-10-CM | POA: Diagnosis not present

## 2023-10-06 DIAGNOSIS — N401 Enlarged prostate with lower urinary tract symptoms: Secondary | ICD-10-CM

## 2023-10-06 DIAGNOSIS — N138 Other obstructive and reflux uropathy: Secondary | ICD-10-CM | POA: Diagnosis not present

## 2023-10-06 LAB — CBC
HCT: 45.4 % (ref 39.0–52.0)
Hemoglobin: 15.1 g/dL (ref 13.0–17.0)
MCHC: 33.2 g/dL (ref 30.0–36.0)
MCV: 93.3 fL (ref 78.0–100.0)
Platelets: 241 10*3/uL (ref 150.0–400.0)
RBC: 4.87 Mil/uL (ref 4.22–5.81)
RDW: 13.3 % (ref 11.5–15.5)
WBC: 7.8 10*3/uL (ref 4.0–10.5)

## 2023-10-06 LAB — BASIC METABOLIC PANEL
BUN: 17 mg/dL (ref 6–23)
CO2: 30 meq/L (ref 19–32)
Calcium: 9.5 mg/dL (ref 8.4–10.5)
Chloride: 103 meq/L (ref 96–112)
Creatinine, Ser: 0.98 mg/dL (ref 0.40–1.50)
GFR: 87.42 mL/min (ref >60.00)
Glucose, Bld: 87 mg/dL (ref 70–99)
Potassium: 5.2 meq/L — ABNORMAL HIGH (ref 3.5–5.1)
Sodium: 141 meq/L (ref 135–145)

## 2023-10-06 LAB — PSA: PSA: 1.15 ng/mL (ref 0.10–4.00)

## 2023-10-06 NOTE — Progress Notes (Incomplete)
ACUTE VISIT Chief Complaint  Patient presents with   weak stream   HPI: DanielDaniel Juarez is a 53 y.o. male with a PMHx significant for GERD, hypothyroidism, benign prostatic hyperplasia with urinary frequency, prediabetes, vitamin D deficiency, and low back pain, who is here today complaining of difficulty urinating.   Urinary problem:  Patient complains of problems urinating for about a month.  He will feel the urge to urinate, but nothing comes out for a moment, and then he has a very weak stream. Other times, he will have dribbling.  Endorses occasional burning sensation.  Currently on Flomax 0.4 mg daily.  Prostate last checked in 11/2022. He has not seen urology. He was referred last year for urinary frequency but cancelled because his problem resolved after seeing an orthopedist for back pain.  He mentions his father had bladder cancer.  No smoking hx.  Pertinent negatives include penile discharge or lesions, rectal pain, fever, chills, or blood in his urine.  Lab Results  Component Value Date   PSA 0.73 11/13/2022   PSA 0.64 01/18/2022   PSA 0.72 08/01/2021   Review of Systems See other pertinent positives and negatives in HPI.  Current Outpatient Medications on File Prior to Visit  Medication Sig Dispense Refill   famotidine (PEPCID) 40 MG tablet TAKE 1 TABLET BY MOUTH EVERYDAY AT BEDTIME 90 tablet 1   levothyroxine (SYNTHROID) 150 MCG tablet Take 1 tablet (150 mcg total) by mouth daily. 90 tablet 3   omeprazole (PRILOSEC) 40 MG capsule TAKE 1 CAPSULE BY MOUTH DAILY 30 MINUTES BEFORE BREAKFAST AND DINNER 180 capsule 0   tamsulosin (FLOMAX) 0.4 MG CAPS capsule Take 1 capsule (0.4 mg total) by mouth daily. 90 capsule 3   Vitamin D, Ergocalciferol, (DRISDOL) 1.25 MG (50000 UNIT) CAPS capsule TAKE 1 CAPSULE BY MOUTH ONCE EVERY 3 WEEKS 4 capsule 3   No current facility-administered medications on file prior to visit.    Past Medical History:  Diagnosis Date    Allergy    Arthritis    Chicken pox    Hyperbilirubinemia    Rosacea, unspecified    Follows with dermatologists.   Thyroid disease    hypo   No Known Allergies  Social History   Socioeconomic History   Marital status: Married    Spouse name: Not on file   Number of children: 3   Years of education: Not on file   Highest education level: Master's degree (e.g., MA, MS, MEng, MEd, MSW, MBA)  Occupational History   Not on file  Tobacco Use   Smoking status: Never   Smokeless tobacco: Never  Vaping Use   Vaping status: Never Used  Substance and Sexual Activity   Alcohol use: Yes    Comment: occasionally   Drug use: No   Sexual activity: Yes    Partners: Female  Other Topics Concern   Not on file  Social History Narrative   Not on file   Social Drivers of Health   Financial Resource Strain: Low Risk  (09/16/2023)   Overall Financial Resource Strain (CARDIA)    Difficulty of Paying Living Expenses: Not hard at all  Food Insecurity: No Food Insecurity (09/16/2023)   Hunger Vital Sign    Worried About Running Out of Food in the Last Year: Never true    Ran Out of Food in the Last Year: Never true  Transportation Needs: No Transportation Needs (09/16/2023)   PRAPARE - Transportation    Lack of Transportation (  Medical): No    Lack of Transportation (Non-Medical): No  Physical Activity: Inactive (09/16/2023)   Exercise Vital Sign    Days of Exercise per Week: 0 days    Minutes of Exercise per Session: 120 min  Stress: No Stress Concern Present (09/16/2023)   [employer] of Occupational Health - Occupational Stress Questionnaire    Feeling of Stress : Not at all  Social Connections: Socially Integrated (09/16/2023)   Social Connection and Isolation Panel [NHANES]    Frequency of Communication with Friends and Family: More than three times a week    Frequency of Social Gatherings with Friends and Family: More than three times a week    Attends Religious Services: More  than 4 times per year    Active Member of Clubs or Organizations: No    Attends Engineer, structural: More than 4 times per year    Marital Status: Married    Vitals:   10/06/23 0920  BP: 120/70  Pulse: 79  Resp: 12  SpO2: 98%   Body mass index is 33.21 kg/m.  Physical Exam Nursing note reviewed.  Constitutional:      General: He is not in acute distress.    Appearance: He is well-developed.  HENT:     Head: Normocephalic and atraumatic.  Eyes:     Conjunctiva/sclera: Conjunctivae normal.  Cardiovascular:     Rate and Rhythm: Normal rate and regular rhythm.     Heart sounds: No murmur heard. Pulmonary:     Effort: Pulmonary effort is normal. No respiratory distress.     Breath sounds: Normal breath sounds.  Abdominal:     Palpations: Abdomen is soft. There is no hepatomegaly or mass.     Tenderness: There is no abdominal tenderness.  Musculoskeletal:     Right lower leg: No edema.     Left lower leg: No edema.  Lymphadenopathy:     Cervical: No cervical adenopathy.  Skin:    General: Skin is warm.     Findings: No erythema or rash.  Neurological:     Mental Status: He is alert and oriented to person, place, and time.     Cranial Nerves: No cranial nerve deficit.     Gait: Gait normal.  Psychiatric:     Comments: Well groomed, good eye contact.     ASSESSMENT AND PLAN:  Daniel Juarez was seen today for difficulty urinating.   There are no diagnoses linked to this encounter.  No follow-ups on file.  I, Rolla Etienne Wierda, acting as a scribe for Betty [place], MD., have documented all relevant documentation on the behalf of Betty [place], MD, as directed by  Betty [place], MD while in the presence of Betty [place], MD.   I, Betty [place], MD, have reviewed all documentation for this visit. The documentation on 10/06/23 for the exam, diagnosis, procedures, and orders are all accurate and complete.  Betty G. [place], MD  [facility]  office.  Discharge Instructions   None

## 2023-10-06 NOTE — Patient Instructions (Addendum)
A few things to remember from today's visit:  BPH with obstruction/lower urinary tract symptoms - Plan: PSA, Ambulatory referral to Urology, CBC, Basic metabolic panel  Dysuria - Plan: Urinalysis with Culture Reflex, CBC  No changes in Flomax. Monitor for new symptoms.  If you need refills for medications you take chronically, please call your pharmacy. Do not use My Chart to request refills or for acute issues that need immediate attention. If you send a my chart message, it may take a few days to be addressed, specially if I am not in the office.  Please be sure medication list is accurate. If a new problem present, please set up appointment sooner than planned today.

## 2023-10-07 LAB — URINALYSIS W MICROSCOPIC + REFLEX CULTURE
Bacteria, UA: NONE SEEN /[HPF]
Bilirubin Urine: NEGATIVE
Glucose, UA: NEGATIVE
Hgb urine dipstick: NEGATIVE
Hyaline Cast: NONE SEEN /[LPF]
Ketones, ur: NEGATIVE
Leukocyte Esterase: NEGATIVE
Nitrites, Initial: NEGATIVE
Protein, ur: NEGATIVE
RBC / HPF: NONE SEEN /[HPF] (ref 0–2)
Specific Gravity, Urine: 1.005 (ref 1.001–1.035)
Squamous Epithelial / HPF: NONE SEEN /[HPF] (ref <=5)
WBC, UA: NONE SEEN /[HPF] (ref 0–5)
pH: 6.5 (ref 5.0–8.0)

## 2023-10-07 LAB — NO CULTURE INDICATED

## 2023-10-10 ENCOUNTER — Other Ambulatory Visit (HOSPITAL_COMMUNITY)
Admission: RE | Admit: 2023-10-10 | Discharge: 2023-10-10 | Disposition: A | Discharge status: Home / Self Care | Payer: Self-pay | Source: Ambulatory Visit | Attending: Oncology | Admitting: Oncology

## 2023-10-29 ENCOUNTER — Other Ambulatory Visit: Payer: Self-pay | Admitting: Internal Medicine

## 2023-10-29 LAB — GENECONNECT MOLECULAR SCREEN: Genetic Analysis Overall Interpretation: NEGATIVE

## 2023-11-10 ENCOUNTER — Other Ambulatory Visit: Payer: Self-pay | Admitting: Nurse Practitioner

## 2023-11-11 DIAGNOSIS — R351 Nocturia: Secondary | ICD-10-CM | POA: Diagnosis not present

## 2023-11-11 DIAGNOSIS — N401 Enlarged prostate with lower urinary tract symptoms: Secondary | ICD-10-CM | POA: Diagnosis not present

## 2023-11-11 DIAGNOSIS — R3912 Poor urinary stream: Secondary | ICD-10-CM | POA: Diagnosis not present

## 2023-11-11 DIAGNOSIS — R3914 Feeling of incomplete bladder emptying: Secondary | ICD-10-CM | POA: Diagnosis not present

## 2023-11-12 ENCOUNTER — Ambulatory Visit: Payer: BC Managed Care – PPO | Admitting: Internal Medicine

## 2023-11-14 ENCOUNTER — Encounter: Payer: Self-pay | Admitting: Internal Medicine

## 2023-11-14 ENCOUNTER — Ambulatory Visit: Admitting: Internal Medicine

## 2023-11-14 VITALS — BP 118/68 | HR 75 | Ht 71.0 in | Wt 236.0 lb

## 2023-11-14 DIAGNOSIS — E559 Vitamin D deficiency, unspecified: Secondary | ICD-10-CM | POA: Diagnosis not present

## 2023-11-14 DIAGNOSIS — E039 Hypothyroidism, unspecified: Secondary | ICD-10-CM

## 2023-11-14 DIAGNOSIS — R7303 Prediabetes: Secondary | ICD-10-CM

## 2023-11-14 NOTE — Progress Notes (Signed)
 Name: Daniel Juarez  MRN/ DOB: [id number], September 22, 1970    Age/ Sex: 53 y.o., male     PCP: [place], Betty G, MD   Reason for Endocrinology Evaluation: Hypothyroidism     Initial Endocrinology Clinic Visit: 08/09/2020    PATIENT IDENTIFIER: Daniel Juarez is a 53 y.o., male with a past medical history of hypothyroidism, environmental allergies and Vitamin D deficiency. He has followed with [facility] since 08/09/2020 for consultative assistance with management of his hypothyroidism.   HISTORICAL SUMMARY:   He has been diagnosed with hypothyroidism in 2008. Baseline thyroid levels not available. He has been on LT-4 replacement since then.      In 2012 he was diagnosed with Vitamin B12 and D deficiency   In  Review of his records he was seen by Dr. Leslie Dales in 2018, per notes he was evaluated in 2014 for cognitive symptoms and fatigue . There was some suspicion of depression and multiple situation stresses at the time,  as all his labs were normal at the time.     He was having issues with anxiety, confusion, loose stools that he attributed to low normal TSH level, we reduced his levothyroxine from 175 to 150 mcg daily   He had concerns about feet stress fractures, but his Alk. Phos, Calcium, vitamin D , PTH , Mg and phos as well as DXA scan were all normal.     SUBJECTIVE:     Today (11/14/2023):  [name] is here for a follow up on hypothyroidism, vitamin D deficiency and prediabetes   Weight has been stable  Continues with occasional constipation  Denies local neck swelling  Denies  palpitations  Denies tremors  Patient feels much better than last visit with improved energy level.  He has started an additional vitamin D3 2000 international units daily as well as vitamin B12    Levothyroxine 150 mcg , half a tablet on  Wednesday and Sundays and 1 tab rest of the week Vitamin D 50,000 EVERY 3 weeks  Vitamin D 2000 iu daily     HISTORY:   Past Medical History:  Past Medical History:  Diagnosis Date   Allergy    Arthritis    Chicken pox    Hyperbilirubinemia    Rosacea, unspecified    Follows with dermatologists.   Thyroid disease    hypo   Past Surgical History:  Past Surgical History:  Procedure Laterality Date   ANTERIOR CERVICAL DECOMP/DISCECTOMY FUSION     COLONOSCOPY     TONSILLECTOMY     VASECTOMY     Social History:  reports that he has never smoked. He has never used smokeless tobacco. He reports current alcohol use. He reports that he does not use drugs. Family History:  Family History  Problem Relation Age of Onset   Diabetes Mother    Hypertension Mother    Atrial fibrillation Mother    Multiple sclerosis Father    Cerebral palsy Sister    Atrial fibrillation Brother    Colon cancer Neg Hx    Esophageal cancer Neg Hx    Rectal cancer Neg Hx    Stomach cancer Neg Hx      HOME MEDICATIONS: Allergies as of 11/14/2023   No Known Allergies      Medication List        Accurate as of November 14, 2023  8:56 AM. If you have any questions, ask your nurse or doctor.  famotidine 40 MG tablet Commonly known as: PEPCID TAKE 1 TABLET BY MOUTH EVERYDAY AT BEDTIME   levothyroxine 150 MCG tablet Commonly known as: Synthroid half a tablet on Sundays and 1 tablet the rest of the week   omeprazole 40 MG capsule Commonly known as: PRILOSEC TAKE 1 CAPSULE BY MOUTH DAILY 30 MINUTES BEFORE BREAKFAST AND DINNER   tamsulosin 0.4 MG Caps capsule Commonly known as: FLOMAX Take 1 capsule (0.4 mg total) by mouth daily.   Vitamin D (Ergocalciferol) 1.25 MG (50000 UNIT) Caps capsule Commonly known as: DRISDOL TAKE 1 CAPSULE BY MOUTH ONCE EVERY 3 WEEKS          OBJECTIVE:   PHYSICAL EXAM: VS: BP 118/68   Pulse 75   Ht 5\' 11"  (1.803 m)   Wt 236 lb (107 kg)   SpO2 98%   BMI 32.92 kg/m    EXAM: General: Pt appears well and is in NAD  Lungs: Clear with good BS bilat   Heart:  Auscultation: RRR.  Abdomen: soft, nontender  Extremities:  BL LE: No pretibial edema   Mental Status: Judgment, insight: Intact Mood and affect: No depression, anxiety, or agitation     DATA REVIEWED:  Latest Reference Range & Units 11/14/23 09:05  Mean Plasma Glucose mg/dL 440  Vitamin D, 10-UVOZDGU 30 - 100 ng/mL >150 (H)  (H): Data is abnormally high  Latest Reference Range & Units 10/06/23 09:59  Sodium 135 - 145 mEq/L 141  Potassium 3.5 - 5.1 mEq/L 5.2 (H)  Chloride 96 - 112 mEq/L 103  CO2 19 - 32 mEq/L 30  Glucose 70 - 99 mg/dL 87  BUN 6 - 23 mg/dL 17  Creatinine 4.40 - 3.47 mg/dL 4.25  Calcium 8.4 - 95.6 mg/dL 9.5  GFR >38.75 mL/min 87.42    Latest Reference Range & Units 11/14/23 09:05  Hemoglobin A1C <5.7 % of total Hgb 5.9 (H)  TSH 0.40 - 4.50 mIU/L 2.01  T4,Free(Direct) 0.8 - 1.8 ng/dL 1.7  (H): Data is abnormally high    Old records , labs and images have been reviewed.    ASSESSMENT / PLAN / RECOMMENDATIONS:   1. Hypothyroidism:   - No local neck symptoms  - He has been taking Levothyroxine appropriately  -He feels clinically better -TSH within normal range, no change Medications   Continue Levothyroxine 150 mcg , half a tablet on Sundays and Wednesdays, 1 tablet the rest of the week   2. Vitamin D Deficiency :   - Vitamin D is extremely high -He is on ergocalciferol every third week, and added OTC vitamin D3 daily -Will switch for ergocalciferol to every other week -Patient to STOP OTC vitamin D3 and recheck in the next 3-6 months   3. Pre-diabetes   - A1c at 5.9  % -Will encourage lifestyle changes   F/U in 6 months    Signed electronically by: Lyndle Herrlich, MD  [facility]  [facility] [address] [place] [place], [place] [address] Phone: [phone] FAX: [phone]      CC: [place], Betty G, MD [address] [place] [address] Phone: [phone]  Fax:  [phone]   Return to Endocrinology clinic as below: No future appointments.

## 2023-11-15 LAB — HEMOGLOBIN A1C
Hgb A1c MFr Bld: 5.9 %{Hb} — ABNORMAL HIGH (ref <5.7)
Mean Plasma Glucose: 123 mg/dL
eAG (mmol/L): 6.8 mmol/L

## 2023-11-15 LAB — T4, FREE: Free T4: 1.7 ng/dL (ref 0.8–1.8)

## 2023-11-15 LAB — TSH: TSH: 2.01 m[IU]/L (ref 0.40–4.50)

## 2023-11-15 LAB — VITAMIN D 25 HYDROXY (VIT D DEFICIENCY, FRACTURES): Vit D, 25-Hydroxy: >150 ng/mL — ABNORMAL HIGH (ref 30–100)

## 2023-11-17 ENCOUNTER — Encounter: Payer: Self-pay | Admitting: Internal Medicine

## 2023-11-17 MED ORDER — VITAMIN D (ERGOCALCIFEROL) 1.25 MG (50000 UNIT) PO CAPS: ORAL_CAPSULE | ORAL | 3 refills | Status: DC

## 2023-11-17 MED ORDER — LEVOTHYROXINE SODIUM 150 MCG PO TABS: ORAL_TABLET | ORAL | 2 refills | Status: DC

## 2023-12-23 DIAGNOSIS — R3912 Poor urinary stream: Secondary | ICD-10-CM | POA: Diagnosis not present

## 2023-12-23 DIAGNOSIS — R3914 Feeling of incomplete bladder emptying: Secondary | ICD-10-CM | POA: Diagnosis not present

## 2023-12-23 DIAGNOSIS — R351 Nocturia: Secondary | ICD-10-CM | POA: Diagnosis not present

## 2023-12-23 DIAGNOSIS — N401 Enlarged prostate with lower urinary tract symptoms: Secondary | ICD-10-CM | POA: Diagnosis not present

## 2024-01-06 DIAGNOSIS — N401 Enlarged prostate with lower urinary tract symptoms: Secondary | ICD-10-CM | POA: Diagnosis not present

## 2024-01-06 DIAGNOSIS — R350 Frequency of micturition: Secondary | ICD-10-CM | POA: Diagnosis not present

## 2024-01-06 DIAGNOSIS — R3915 Urgency of urination: Secondary | ICD-10-CM | POA: Diagnosis not present

## 2024-01-06 DIAGNOSIS — R3911 Hesitancy of micturition: Secondary | ICD-10-CM | POA: Diagnosis not present

## 2024-02-22 ENCOUNTER — Other Ambulatory Visit: Payer: Self-pay | Admitting: Nurse Practitioner

## 2024-03-10 ENCOUNTER — Ambulatory Visit: Admitting: Family Medicine

## 2024-03-24 DIAGNOSIS — M62838 Other muscle spasm: Secondary | ICD-10-CM | POA: Diagnosis not present

## 2024-03-24 DIAGNOSIS — M6289 Other specified disorders of muscle: Secondary | ICD-10-CM | POA: Diagnosis not present

## 2024-03-24 DIAGNOSIS — R3914 Feeling of incomplete bladder emptying: Secondary | ICD-10-CM | POA: Diagnosis not present

## 2024-03-24 DIAGNOSIS — M6281 Muscle weakness (generalized): Secondary | ICD-10-CM | POA: Diagnosis not present

## 2024-04-28 DIAGNOSIS — R350 Frequency of micturition: Secondary | ICD-10-CM | POA: Diagnosis not present

## 2024-04-28 DIAGNOSIS — M6289 Other specified disorders of muscle: Secondary | ICD-10-CM | POA: Diagnosis not present

## 2024-04-28 DIAGNOSIS — M6281 Muscle weakness (generalized): Secondary | ICD-10-CM | POA: Diagnosis not present

## 2024-04-28 DIAGNOSIS — M62838 Other muscle spasm: Secondary | ICD-10-CM | POA: Diagnosis not present

## 2024-05-17 ENCOUNTER — Ambulatory Visit (INDEPENDENT_AMBULATORY_CARE_PROVIDER_SITE_OTHER): Admitting: Internal Medicine

## 2024-05-17 ENCOUNTER — Encounter: Payer: Self-pay | Admitting: Internal Medicine

## 2024-05-17 VITALS — BP 120/78 | HR 68 | Ht 71.0 in | Wt 221.0 lb

## 2024-05-17 DIAGNOSIS — E559 Vitamin D deficiency, unspecified: Secondary | ICD-10-CM | POA: Diagnosis not present

## 2024-05-17 DIAGNOSIS — R7303 Prediabetes: Secondary | ICD-10-CM | POA: Diagnosis not present

## 2024-05-17 DIAGNOSIS — E039 Hypothyroidism, unspecified: Secondary | ICD-10-CM

## 2024-05-17 NOTE — Progress Notes (Unsigned)
 Name: Daniel Juarez  MRN/ DOB: [id number], 11/24/1970    Age/ Sex: 53 y.o., male     PCP: [place], Betty G, MD   Reason for Endocrinology Evaluation: Hypothyroidism     Initial Endocrinology Clinic Visit: 08/09/2020    PATIENT IDENTIFIER: [name] is a 53 y.o., male with a past medical history of hypothyroidism, environmental allergies and Vitamin D  deficiency. He has followed with [facility] since 08/09/2020 for consultative assistance with management of his hypothyroidism.   HISTORICAL SUMMARY:   He has been diagnosed with hypothyroidism in 2008. Baseline thyroid  levels not available. He has been on LT-4 replacement since then.      In 2012 he was diagnosed with Vitamin B12 and D deficiency   In  Review of his records he was seen by Dr. Mirna in 2018, per notes he was evaluated in 2014 for cognitive symptoms and fatigue . There was some suspicion of depression and multiple situation stresses at the time,  as all his labs were normal at the time.     He was having issues with anxiety, confusion, loose stools that he attributed to low normal TSH level, we reduced his levothyroxine  from 175 to 150 mcg daily   He had concerns about feet stress fractures, but his Alk. Phos, Calcium, vitamin D  , PTH , Mg and phos as well as DXA scan were all normal.     SUBJECTIVE:     Today (05/17/2024):  Daniel Juarez is here for a follow up on hypothyroidism, vitamin D  deficiency and prediabetes  Patient has been evaluated by physical therapy for frequency Patient under the care of urology for BPH Patient has been noted weight loss since his last visit here, through intermittent fasting and exercise  Bowel movements have been improving Has noted foggy and slow thinking with memory issues  Has also noted agitation and irritability  Has noted heat intolerance and jittery sensation  Has noted insomnia as well    Levothyroxine  150 mcg , half a tablet on   Wednesday and Sundays and 1 tab rest of the week Vitamin D  50,000 EVERY 3 weeks      HISTORY:  Past Medical History:  Past Medical History:  Diagnosis Date   Allergy    Arthritis    Chicken pox    Hyperbilirubinemia    Rosacea, unspecified    Follows with dermatologists.   Thyroid  disease    hypo   Past Surgical History:  Past Surgical History:  Procedure Laterality Date   ANTERIOR CERVICAL DECOMP/DISCECTOMY FUSION     COLONOSCOPY     TONSILLECTOMY     VASECTOMY     Social History:  reports that he has never smoked. He has never used smokeless tobacco. He reports current alcohol use. He reports that he does not use drugs. Family History:  Family History  Problem Relation Age of Onset   Diabetes Mother    Hypertension Mother    Atrial fibrillation Mother    Multiple sclerosis Father    Cerebral palsy Sister    Atrial fibrillation Brother    Colon cancer Neg Hx    Esophageal cancer Neg Hx    Rectal cancer Neg Hx    Stomach cancer Neg Hx      HOME MEDICATIONS: Allergies as of 05/17/2024   No Known Allergies      Medication List        Accurate as of May 17, 2024  2:14 PM. If you  have any questions, ask your nurse or doctor.          famotidine  40 MG tablet Commonly known as: PEPCID  TAKE 1 TABLET BY MOUTH EVERYDAY AT BEDTIME   levothyroxine  150 MCG tablet Commonly known as: Synthroid  half a tablet on Wednesdays and and 1 tablet the rest of the week   omeprazole  40 MG capsule Commonly known as: PRILOSEC TAKE 1 CAPSULE BY MOUTH DAILY 30 MINUTES BEFORE BREAKFAST AND DINNER   tamsulosin  0.4 MG Caps capsule Commonly known as: FLOMAX  Take 1 capsule (0.4 mg total) by mouth daily.   Vitamin D  (Ergocalciferol ) 1.25 MG (50000 UNIT) Caps capsule Commonly known as: DRISDOL  TAKE 1 CAPSULE every other week          OBJECTIVE:   PHYSICAL EXAM: VS: BP 120/78 (BP Location: Left Arm, Patient Position: Sitting, Cuff Size: Normal)   Pulse 68    Ht 5' 11 (1.803 m)   Wt 221 lb (100.2 kg)   SpO2 98%   BMI 30.82 kg/m    EXAM: General: Pt appears well and is in NAD  Lungs: Clear with good BS bilat   Heart: Auscultation: RRR.  Abdomen: soft, nontender  Extremities:  BL LE: No pretibial edema   Mental Status: Judgment, insight: Intact Mood and affect: No depression, anxiety, or agitation     DATA REVIEWED:   ******  Old records , labs and images have been reviewed.    ASSESSMENT / PLAN / RECOMMENDATIONS:   1. Hypothyroidism:   - Patient on multiple symptoms - ***    Medications   Continue Levothyroxine  150 mcg , half a tablet on Sundays and Wednesdays, 1 tablet the rest of the week   2. Vitamin D  Deficiency :  - Vitamin D  is extremely high -He is on ergocalciferol  every third week   3. Pre-diabetes   - A1c at 5.9  % -Will encourage lifestyle changes   F/U in 6 months    Signed electronically by: Stefano Redgie Butts, MD  [facility]  [facility] [address] [place] [place], [place] [address] Phone: [phone] FAX: [phone]      CC: [place], Betty G, MD [address] [place] [address] Phone: [phone]  Fax: [phone]   Return to Endocrinology clinic as below: Future Appointments  Date Time Provider Department Center  05/17/2024  2:20 PM Madix Blowe, Donell Redgie, MD [id number] None

## 2024-05-17 NOTE — Patient Instructions (Signed)

## 2024-05-18 ENCOUNTER — Ambulatory Visit: Payer: Self-pay | Admitting: Internal Medicine

## 2024-05-18 LAB — HEMOGLOBIN A1C
Hgb A1c MFr Bld: 5.8 % — ABNORMAL HIGH (ref <5.7)
Mean Plasma Glucose: 120 mg/dL
eAG (mmol/L): 6.6 mmol/L

## 2024-05-18 LAB — T4, FREE: Free T4: 1.6 ng/dL (ref 0.8–1.8)

## 2024-05-18 LAB — TSH: TSH: 1.49 m[IU]/L (ref 0.40–4.50)

## 2024-05-18 LAB — VITAMIN D 25 HYDROXY (VIT D DEFICIENCY, FRACTURES): Vit D, 25-Hydroxy: 91 ng/mL (ref 30–100)

## 2024-05-18 MED ORDER — LEVOTHYROXINE SODIUM 150 MCG PO TABS: ORAL_TABLET | ORAL | 2 refills | Status: AC

## 2024-05-20 ENCOUNTER — Other Ambulatory Visit: Payer: Self-pay | Admitting: Nurse Practitioner

## 2024-05-21 ENCOUNTER — Ambulatory Visit: Admitting: Internal Medicine

## 2024-06-07 DIAGNOSIS — R3914 Feeling of incomplete bladder emptying: Secondary | ICD-10-CM | POA: Diagnosis not present

## 2024-06-30 ENCOUNTER — Ambulatory Visit: Payer: Self-pay | Admitting: Family Medicine

## 2024-06-30 ENCOUNTER — Encounter: Payer: Self-pay | Admitting: Family Medicine

## 2024-06-30 ENCOUNTER — Ambulatory Visit: Admitting: Family Medicine

## 2024-06-30 VITALS — BP 120/80 | HR 68 | Temp 98.4°F | Resp 16 | Ht 72.0 in | Wt 218.0 lb

## 2024-06-30 DIAGNOSIS — K2100 Gastro-esophageal reflux disease with esophagitis, without bleeding: Secondary | ICD-10-CM | POA: Diagnosis not present

## 2024-06-30 DIAGNOSIS — R350 Frequency of micturition: Secondary | ICD-10-CM

## 2024-06-30 DIAGNOSIS — Z0000 Encounter for general adult medical examination without abnormal findings: Secondary | ICD-10-CM | POA: Diagnosis not present

## 2024-06-30 DIAGNOSIS — Z13220 Encounter for screening for lipoid disorders: Secondary | ICD-10-CM | POA: Diagnosis not present

## 2024-06-30 DIAGNOSIS — N401 Enlarged prostate with lower urinary tract symptoms: Secondary | ICD-10-CM

## 2024-06-30 LAB — LIPID PANEL
Cholesterol: 133 mg/dL (ref 0–200)
HDL: 66.4 mg/dL (ref >39.00)
LDL Cholesterol: 51 mg/dL (ref 0–99)
NonHDL: 67.05
Total CHOL/HDL Ratio: 2
Triglycerides: 78 mg/dL (ref 0.0–149.0)
VLDL: 15.6 mg/dL (ref 0.0–40.0)

## 2024-06-30 NOTE — Patient Instructions (Addendum)
 A few things to remember from today's visit:  Routine general medical examination at a health care facility  Screening for lipoid disorders - Plan: Lipid panel  Try decreasing Omeprazole  gradually to once daily.  Do not use My Chart to request refills or for acute issues that need immediate attention. If you send a my chart message, it may take a few days to be addressed, specially if I am not in the office.  Please be sure medication list is accurate. If a new problem present, please set up appointment sooner than planned today.  Health Maintenance, Male Adopting a healthy lifestyle and getting preventive care are important in promoting health and wellness. Ask your health care provider about: The right schedule for you to have regular tests and exams. Things you can do on your own to prevent diseases and keep yourself healthy. What should I know about diet, weight, and exercise? Eat a healthy diet  Eat a diet that includes plenty of vegetables, fruits, low-fat dairy products, and lean protein. Do not eat a lot of foods that are high in solid fats, added sugars, or sodium. Maintain a healthy weight Body mass index (BMI) is a measurement that can be used to identify possible weight problems. It estimates body fat based on height and weight. Your health care provider can help determine your BMI and help you achieve or maintain a healthy weight. Get regular exercise Get regular exercise. This is one of the most important things you can do for your health. Most adults should: Exercise for at least 150 minutes each week. The exercise should increase your heart rate and make you sweat (moderate-intensity exercise). Do strengthening exercises at least twice a week. This is in addition to the moderate-intensity exercise. Spend less time sitting. Even light physical activity can be beneficial. Watch cholesterol and blood lipids Have your blood tested for lipids and cholesterol at 55 years of  age, then have this test every 5 years. You may need to have your cholesterol levels checked more often if: Your lipid or cholesterol levels are high. You are older than 55 years of age. You are at high risk for heart disease. What should I know about cancer screening? Many types of cancers can be detected early and may often be prevented. Depending on your health history and family history, you may need to have cancer screening at various ages. This may include screening for: Colorectal cancer. Prostate cancer. Skin cancer. Lung cancer. What should I know about heart disease, diabetes, and high blood pressure? Blood pressure and heart disease High blood pressure causes heart disease and increases the risk of stroke. This is more likely to develop in people who have high blood pressure readings or are overweight. Talk with your health care provider about your target blood pressure readings. Have your blood pressure checked: Every 3-5 years if you are 29-58 years of age. Every year if you are 66 years old or older. If you are between the ages of 73 and 68 and are a current or former smoker, ask your health care provider if you should have a one-time screening for abdominal aortic aneurysm (AAA). Diabetes Have regular diabetes screenings. This checks your fasting blood sugar level. Have the screening done: Once every three years after age 67 if you are at a normal weight and have a low risk for diabetes. More often and at a younger age if you are overweight or have a high risk for diabetes. What should I know about  preventing infection? Hepatitis B If you have a higher risk for hepatitis B, you should be screened for this virus. Talk with your health care provider to find out if you are at risk for hepatitis B infection. Hepatitis C Blood testing is recommended for: Everyone born from [birth year] through 1967. Anyone with known risk factors for hepatitis C. Sexually transmitted infections  (STIs) You should be screened each year for STIs, including gonorrhea and chlamydia, if: You are sexually active and are younger than 55 years of age. You are older than 55 years of age and your health care provider tells you that you are at risk for this type of infection. Your sexual activity has changed since you were last screened, and you are at increased risk for chlamydia or gonorrhea. Ask your health care provider if you are at risk. Ask your health care provider about whether you are at high risk for HIV. Your health care provider may recommend a prescription medicine to help prevent HIV infection. If you choose to take medicine to prevent HIV, you should first get tested for HIV. You should then be tested every 3 months for as long as you are taking the medicine. Follow these instructions at home: Alcohol use Do not drink alcohol if your health care provider tells you not to drink. If you drink alcohol: Limit how much you have to 0-2 drinks a day. Know how much alcohol is in your drink. In the [place] one drink equals one 12 oz bottle of beer (355 mL), one 5 oz glass of wine (148 mL), or one 1 oz glass of hard liquor (44 mL). Lifestyle Do not use any products that contain nicotine or tobacco. These products include cigarettes, chewing tobacco, and vaping devices, such as e-cigarettes. If you need help quitting, ask your health care provider. Do not use street drugs. Do not share needles. Ask your health care provider for help if you need support or information about quitting drugs. General instructions Schedule regular health, dental, and eye exams. Stay current with your vaccines. Tell your health care provider if: You often feel depressed. You have ever been abused or do not feel safe at home. Summary Adopting a healthy lifestyle and getting preventive care are important in promoting health and wellness. Follow your health care provider's instructions about healthy diet,  exercising, and getting tested or screened for diseases. Follow your health care provider's instructions on monitoring your cholesterol and blood pressure. This information is not intended to replace advice given to you by your health care provider. Make sure you discuss any questions you have with your health care provider. Document Revised: 01/08/2021 Document Reviewed: 01/08/2021 Elsevier Patient Education  2024 [employer].

## 2024-06-30 NOTE — Assessment & Plan Note (Signed)
 Problem is well-controlled with omeprazole  40 mg twice daily and famotidine  40 mg daily. Interested in trying to decrease pharmacologic treatment, he can try alternating between omeprazole  40 mg twice daily and once daily for 2 weeks then once daily 30 minutes before lunch. Continue GERD precautions. Follows with GI.

## 2024-06-30 NOTE — Assessment & Plan Note (Signed)
 Problem has improved with tamsulosin  0.4 mg 2 tablets daily. Follows with urologist annually.

## 2024-06-30 NOTE — Assessment & Plan Note (Signed)
 We discussed the importance of regular physical activity and healthy diet for prevention of chronic illness and/or complications. Preventive guidelines reviewed. Vaccination updated today, Prevnar 20 given. Next CPE in a year.

## 2024-06-30 NOTE — Progress Notes (Unsigned)
 Chief Complaint  Patient presents with   Annual Exam   HPI: [name] is a 53 y.o.male with a PMHx significant for GERD, hypothyroidism, benign prostatic hyperplasia with urinary frequency, prediabetes, vitamin D  deficiency, and low back pain, who is here today for his routine physical examination. Discussed the use of AI scribe software for clinical note transcription with the patient, who gave verbal consent to proceed.  History of Present Illness Daniel Juarez is a 53 year old male who presents for an annual physical exam.  He engages in regular physical activity, attending the gym three times a week and participating in yard work. His diet consists mainly of home-cooked meals, with a focus on vegetables five days a week and daily fruit intake, including apples and bananas. He practices intermittent fasting, eating between 12 PM and 8 PM, which has led to a weight loss of approximately 20 pounds since February 2025, from a previous weight of 238 pounds.  He sleeps an average of seven hours per night and consumes alcohol rarely, about one beer every three months. He does not smoke. He had a colonoscopy in 2021 and a PSA check in February 2025. He has noticed a decrease in urinary frequency since starting tamsulosin , which he takes at a dose of 0.8 mg daily.  He has a history of back pain, which he manages by avoiding activities that exacerbate the pain. He attends physical therapy for pelvic floor exercises and takes omeprazole  twice daily for heartburn, which occurs if he misses a dose.  He follows up with an endocrinologist and a urologist. His recent A1c was 5.8%. He is not currently taking B12 supplements, although his levels were previously in the lower normal range. His vitamin D  levels were previously high.  He has received his flu shot in September 2025 and is up to date with his Tdap and shingles vaccines.   Last CPE: 06/30/23.  Exercise: He says he is  active at home.  Diet: He states he eats healthy. He mainly cooks at home and eats vegetables most days.  Sleep: He reports he sleeps 7-8 hours per night.  Alcohol Use: He says he rarely drinks, approximately once every 3-4 months.  Smoking: never Vision: UTD on routine vision care. Dental: UTD on routine dental care. He sees them twice per year.   Immunization History  Administered Date(s) Administered   Influenza Inj Mdck Quad Pf 05/20/2023   Influenza,inj,Quad PF,6+ Mos 05/23/2021   Influenza-Unspecified 06/04/2017, 06/10/2018, 06/27/2019, 06/29/2020, 05/21/2022, 05/17/2024   PFIZER Comirnaty(Gray Top)Covid-19 Tri-Sucrose Vaccine 02/07/2021   PFIZER(Purple Top)SARS-COV-2 Vaccination 11/26/2019, 12/15/2019, 08/07/2020   Tdap 10/13/2017, 08/06/2021   Zoster Recombinant(Shingrix) 03/09/2021, 09/24/2021   Health Maintenance  Topic Date Due   Hepatitis B Vaccines 19-59 Average Risk (1 of 3 - 19+ 3-dose series) Never done   Pneumococcal Vaccine: 50+ Years (1 of 1 - PCV) Never done   COVID-19 Vaccine (5 - 2025-26 season) 05/03/2024   HIV Screening  11/03/2024 (Originally 04/04/1986)   Colonoscopy  10/03/2026   DTaP/Tdap/Td (3 - Td or Tdap) 08/07/2031   Influenza Vaccine  Completed   Hepatitis C Screening  Completed   Zoster Vaccines- Shingrix  Completed   HPV VACCINES  Aged Out   Meningococcal B Vaccine  Aged Out   Last prostate ca screening:  BPH:He has not seen a urologist since his last visit.  He still takes Flomax  0.4 mg daily, which he feels like it helps with urinary frequency.  Lab Results  Component Value Date   PSA 1.15 10/06/2023   PSA 0.73 11/13/2022   PSA 0.64 01/18/2022     Hypothyroidism:  He is currently taking levothyroxine  150 mcg daily.  He sees his endocrinologist every six months.  Lab Results  Component Value Date   TSH 1.49 05/17/2024   Prediabetes: Follows with endocrinologist. Lab Results  Component Value Date   HGBA1C 5.8 (H) 05/17/2024    GERD:  He still takes famotidine  40 mg daily and omeprazole  40 mg bid.  He says he still has heartburn if he doesn't take his medications.   Vitamin D /Vitamin B12 deficiency: Follows with endocrinologist. Lab Results  Component Value Date   VD25OH 91 05/17/2024   Lab Results  Component Value Date   VITAMINB12 276 05/12/2023   Review of Systems  Constitutional:  Negative for activity change, appetite change and fever.  HENT:  Negative for nosebleeds, sore throat and trouble swallowing.   Eyes:  Negative for redness and visual disturbance.  Respiratory:  Negative for cough, shortness of breath and wheezing.   Cardiovascular:  Negative for chest pain, palpitations and leg swelling.  Gastrointestinal:  Negative for abdominal pain, blood in stool, nausea and vomiting.  Endocrine: Negative for cold intolerance, heat intolerance, polydipsia, polyphagia and polyuria.  Genitourinary:  Negative for decreased urine volume, dysuria, genital sores, hematuria and testicular pain.  Musculoskeletal:  Negative for gait problem.  Skin:  Negative for color change and rash.  Allergic/Immunologic: Positive for environmental allergies.  Neurological:  Negative for syncope, weakness and headaches.  Hematological:  Negative for adenopathy. Does not bruise/bleed easily.  Psychiatric/Behavioral:  Negative for confusion and sleep disturbance.   All other systems reviewed and are negative.  Current Outpatient Medications on File Prior to Visit  Medication Sig Dispense Refill   famotidine  (PEPCID ) 40 MG tablet TAKE 1 TABLET BY MOUTH EVERYDAY AT BEDTIME 90 tablet 1   levothyroxine  (SYNTHROID ) 150 MCG tablet half a tablet on Wednesday and Sundays and 1 tablet the rest of the week 78 tablet 2   omeprazole  (PRILOSEC) 40 MG capsule TAKE 1 CAPSULE BY MOUTH DAILY 30 MINUTES BEFORE BREAKFAST AND DINNER 180 capsule 0   tamsulosin  (FLOMAX ) 0.4 MG CAPS capsule Take 1 capsule (0.4 mg total) by mouth daily. 90 capsule  3   No current facility-administered medications on file prior to visit.   Past Medical History:  Diagnosis Date   Allergy    Arthritis    Chicken pox    Hyperbilirubinemia    Rosacea, unspecified    Follows with dermatologists.   Thyroid  disease    hypo   Past Surgical History:  Procedure Laterality Date   ANTERIOR CERVICAL DECOMP/DISCECTOMY FUSION     COLONOSCOPY     TONSILLECTOMY     VASECTOMY      No Known Allergies  Family History  Problem Relation Age of Onset   Diabetes Mother    Hypertension Mother    Atrial fibrillation Mother    Multiple sclerosis Father    Cerebral palsy Sister    Atrial fibrillation Brother    Colon cancer Neg Hx    Esophageal cancer Neg Hx    Rectal cancer Neg Hx    Stomach cancer Neg Hx     Social History   Socioeconomic History   Marital status: Married    Spouse name: Not on file   Number of children: 3   Years of education: Not on file   Highest education level: Master's  degree (e.g., MA, MS, MEng, MEd, MSW, MBA)  Occupational History   Not on file  Tobacco Use   Smoking status: Never   Smokeless tobacco: Never  Vaping Use   Vaping status: Never Used  Substance and Sexual Activity   Alcohol use: Yes    Comment: occasionally   Drug use: No   Sexual activity: Yes    Partners: Female  Other Topics Concern   Not on file  Social History Narrative   Not on file   Social Drivers of Health   Financial Resource Strain: Low Risk  (06/28/2024)   Overall Financial Resource Strain (CARDIA)    Difficulty of Paying Living Expenses: Not hard at all  Food Insecurity: No Food Insecurity (06/28/2024)   Hunger Vital Sign    Worried About Running Out of Food in the Last Year: Never true    Ran Out of Food in the Last Year: Never true  Transportation Needs: No Transportation Needs (06/28/2024)   PRAPARE - Administrator, Civil Service (Medical): No    Lack of Transportation (Non-Medical): No  Physical Activity:  Sufficiently Active (06/28/2024)   Exercise Vital Sign    Days of Exercise per Week: 3 days    Minutes of Exercise per Session: 60 min  Stress: No Stress Concern Present (06/28/2024)   [employer] of Occupational Health - Occupational Stress Questionnaire    Feeling of Stress: Not at all  Social Connections: Socially Integrated (06/28/2024)   Social Connection and Isolation Panel    Frequency of Communication with Friends and Family: More than three times a week    Frequency of Social Gatherings with Friends and Family: More than three times a week    Attends Religious Services: More than 4 times per year    Active Member of [employer] or Organizations: Yes    Attends Engineer, Structural: More than 4 times per year    Marital Status: Married   Vitals:   06/30/24 1302  BP: 120/80  Pulse: 68  Resp: 16  Temp: 98.4 F (36.9 C)  SpO2: 97%   Body mass index is 29.57 kg/m.  Wt Readings from Last 3 Encounters:  06/30/24 218 lb (98.9 kg)  05/17/24 221 lb (100.2 kg)  11/14/23 236 lb (107 kg)   Physical Exam Vitals and nursing note reviewed.  Constitutional:      General: He is not in acute distress.    Appearance: He is well-developed.  HENT:     Head: Normocephalic and atraumatic.     Right Ear: Tympanic membrane, ear canal and external ear normal.     Left Ear: Tympanic membrane, ear canal and external ear normal.     Mouth/Throat:     Mouth: Mucous membranes are moist.  Eyes:     Extraocular Movements: Extraocular movements intact.     Conjunctiva/sclera: Conjunctivae normal.     Pupils: Pupils are equal, round, and reactive to light.  Neck:     Thyroid : No thyroid  mass.  Cardiovascular:     Rate and Rhythm: Normal rate and regular rhythm.     Pulses:          Dorsalis pedis pulses are 2+ on the right side and 2+ on the left side.     Heart sounds: No murmur heard. Pulmonary:     Effort: Pulmonary effort is normal. No respiratory distress.     Breath  sounds: Normal breath sounds.  Abdominal:     Palpations: Abdomen is  soft. There is no hepatomegaly or mass.     Tenderness: There is no abdominal tenderness.  Genitourinary:    Comments: No concerns. Musculoskeletal:        General: No tenderness.     Cervical back: Normal range of motion.     Right lower leg: No edema.     Left lower leg: No edema.     Comments: No major deformities appreciated and no signs of synovitis.  Lymphadenopathy:     Cervical: No cervical adenopathy.     Upper Body:     Right upper body: No supraclavicular adenopathy.     Left upper body: No supraclavicular adenopathy.  Skin:    General: Skin is warm.     Findings: No erythema.  Neurological:     General: No focal deficit present.     Mental Status: He is alert and oriented to person, place, and time.     Cranial Nerves: No cranial nerve deficit.     Sensory: No sensory deficit.     Gait: Gait normal.     Deep Tendon Reflexes:     Reflex Scores:      Bicep reflexes are 2+ on the right side and 2+ on the left side.      Patellar reflexes are 2+ on the right side and 2+ on the left side. Psychiatric:        Mood and Affect: Mood and affect normal.   ASSESSMENT AND PLAN:  Daniel Juarez was seen today for his routine general medical examination.   Routine general medical examination at a health care facility  Screening for lipoid disorders -     Lipid panel; Future  Return in 1 year (on 06/30/2025) for CPE.  Alaric Gladwin G. Shigeo Baugh, MD  [facility]. [place] office.

## 2024-09-09 ENCOUNTER — Other Ambulatory Visit: Payer: Self-pay | Admitting: Nurse Practitioner

## 2024-09-19 ENCOUNTER — Other Ambulatory Visit: Payer: Self-pay | Admitting: Internal Medicine

## 2024-09-19 DIAGNOSIS — E559 Vitamin D deficiency, unspecified: Secondary | ICD-10-CM

## 2024-09-20 ENCOUNTER — Other Ambulatory Visit: Payer: Self-pay | Admitting: Nurse Practitioner

## 2024-10-06 ENCOUNTER — Other Ambulatory Visit: Payer: Self-pay | Admitting: Nurse Practitioner

## 2024-11-16 ENCOUNTER — Ambulatory Visit: Admitting: Internal Medicine
